# Patient Record
Sex: Female | Born: 1985 | Race: Black or African American | Hispanic: No | Marital: Single | Smoking: Never smoker
Health system: Southern US, Community
[De-identification: ages and names within clinical notes are randomized; demographics above are authoritative.]

---

## 2009-11-19 ENCOUNTER — Ambulatory Visit (HOSPITAL_COMMUNITY): Admission: RE | Admit: 2009-11-19 | Discharge: 2009-11-19 | Payer: Self-pay | Admitting: Obstetrics & Gynecology

## 2010-01-18 ENCOUNTER — Ambulatory Visit (HOSPITAL_COMMUNITY): Admission: RE | Admit: 2010-01-18 | Discharge: 2010-01-18 | Payer: Self-pay | Admitting: Family Medicine

## 2010-02-02 ENCOUNTER — Inpatient Hospital Stay (HOSPITAL_COMMUNITY): Admission: AD | Admit: 2010-02-02 | Discharge: 2010-02-04 | Payer: Self-pay | Admitting: Obstetrics & Gynecology

## 2010-02-02 ENCOUNTER — Ambulatory Visit: Payer: Self-pay | Admitting: Family Medicine

## 2010-11-06 LAB — CBC
HCT: 37.5 % (ref 36.0–46.0)
Hemoglobin: 12.3 g/dL (ref 12.0–15.0)
MCHC: 32.9 g/dL (ref 30.0–36.0)
RDW: 14.8 % (ref 11.5–15.5)
WBC: 17 10*3/uL — ABNORMAL HIGH (ref 4.0–10.5)

## 2010-11-06 LAB — GLUCOSE, CAPILLARY
Glucose-Capillary: 140 mg/dL — ABNORMAL HIGH (ref 70–99)
Glucose-Capillary: 83 mg/dL (ref 70–99)

## 2012-03-01 IMAGING — US US OB COMP +14 WK
1 series · 14 of 28 positions shown · non-contrast
Comparison: none

OBSTETRICAL ULTRASOUND:
 This ultrasound exam was performed in the [HOSPITAL] Ultrasound Department.  The OB US report was generated in the AS system, and faxed to the ordering physician.  This report is also available in [HOSPITAL]?s AccessANYware and in [REDACTED] PACS.

[Series 1: us ob comp +14 wk · 0.30mm/px · 14 of 61 slices shown]
[im 3/61]
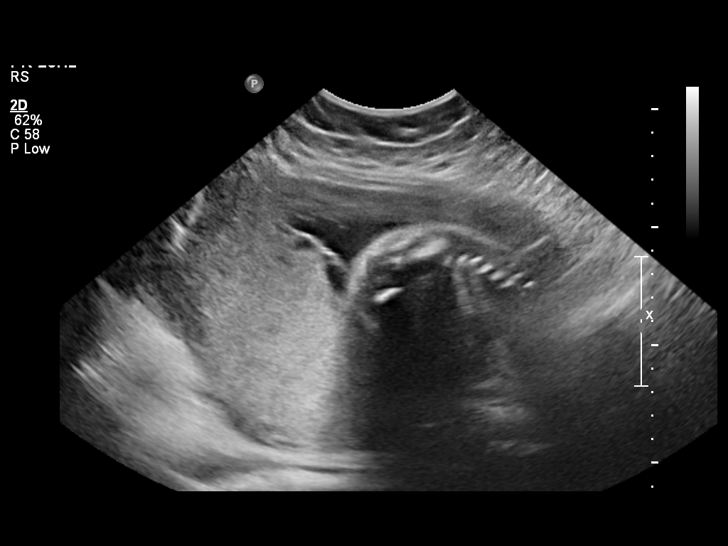
[im 7/61]
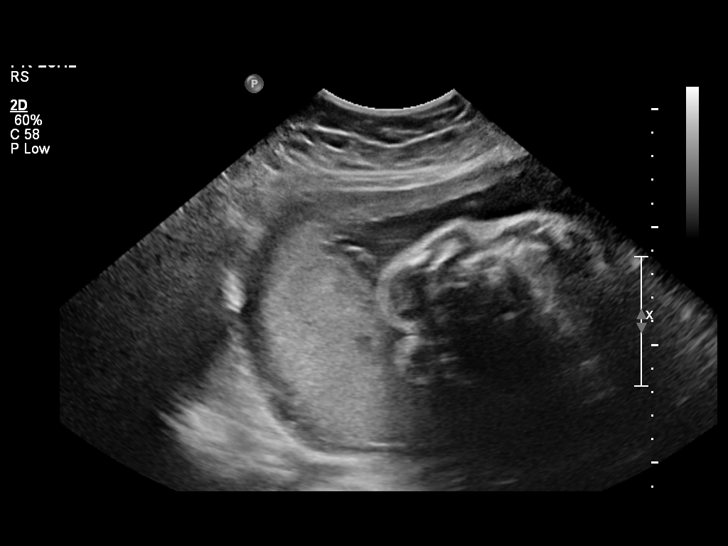
[im 12/61]
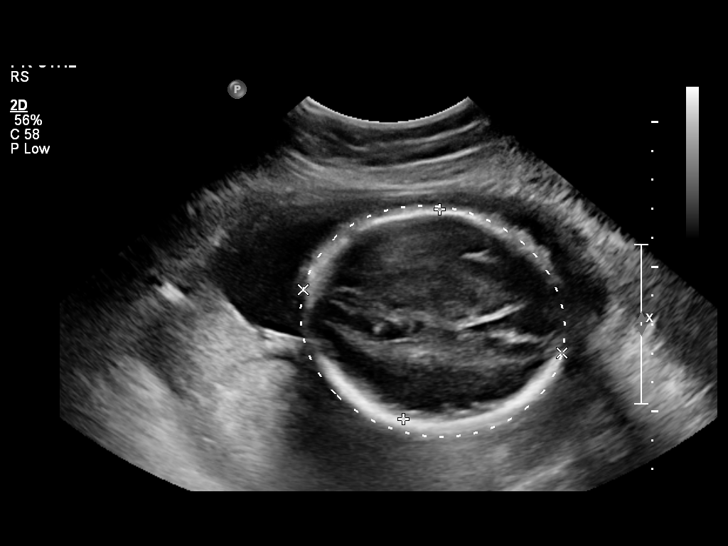
[im 16/61]
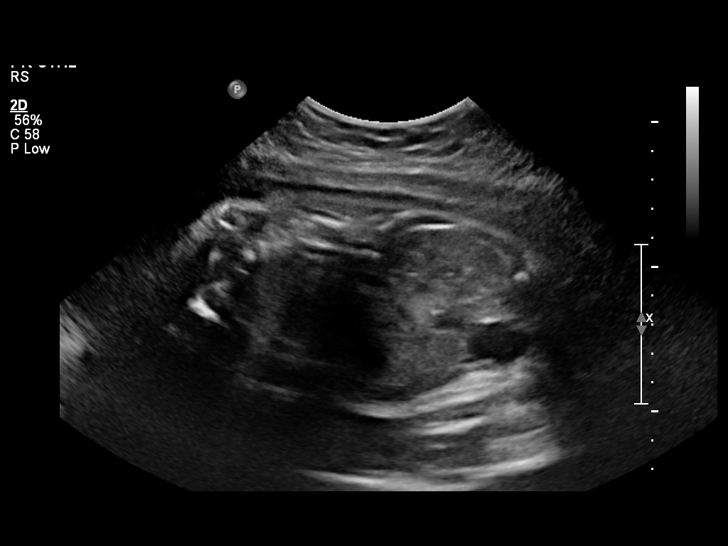
[im 21/61]
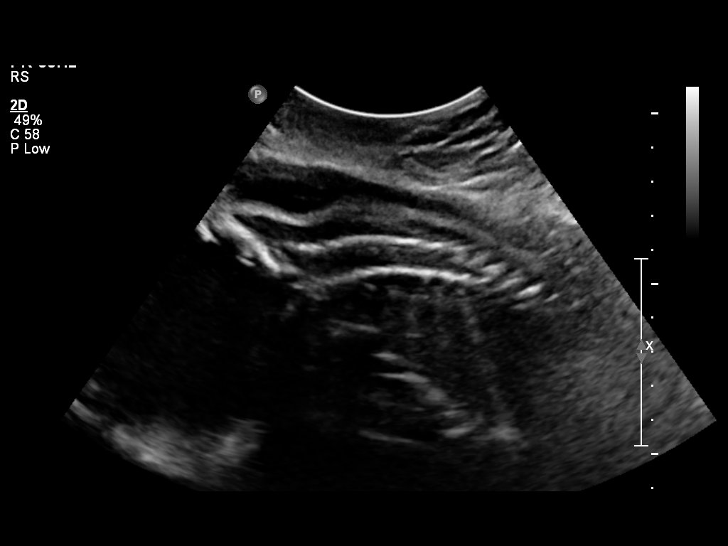
[im 25/61]
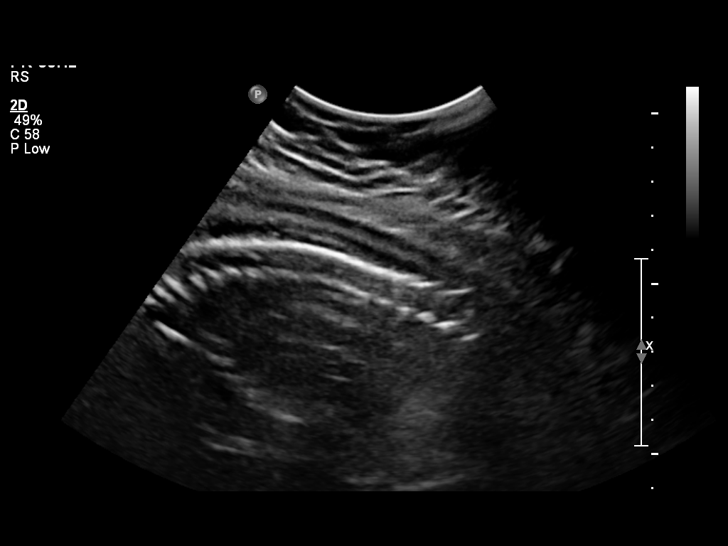
[im 29/61]
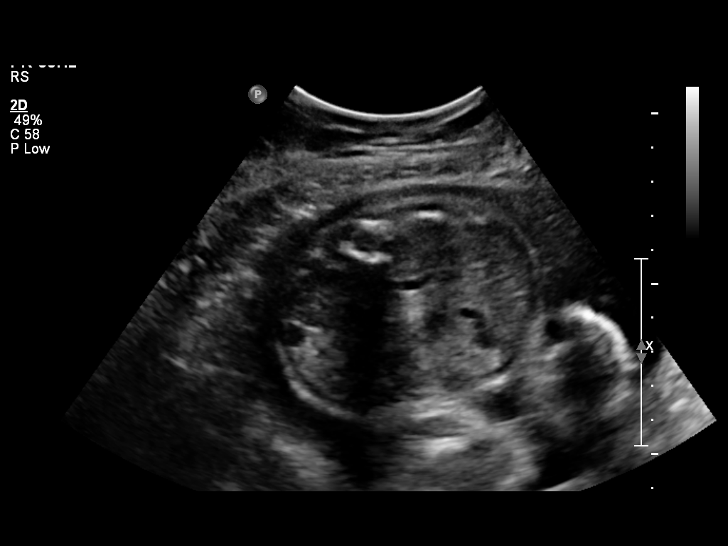
[im 34/61]
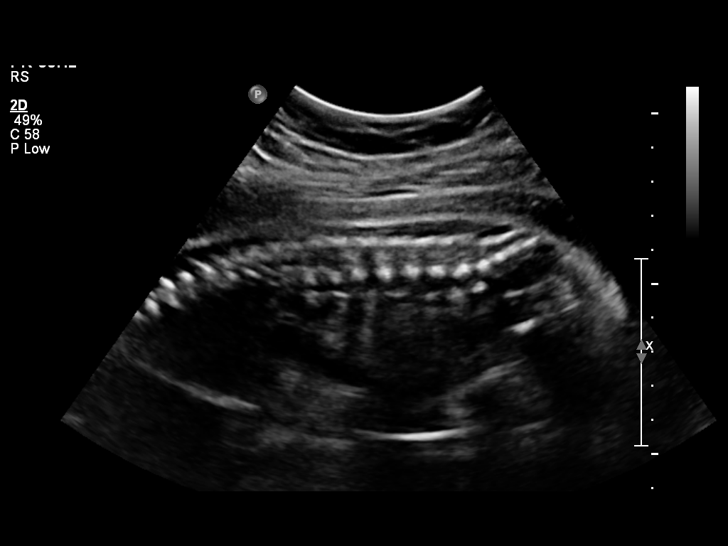
[im 38/61]
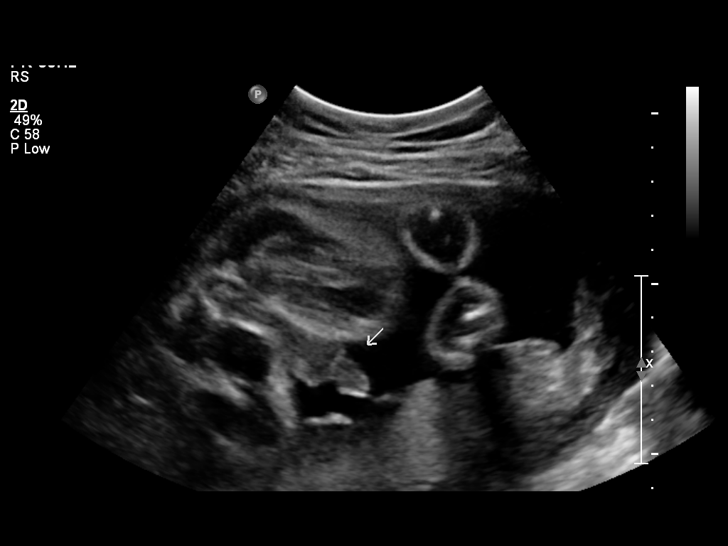
[im 43/61]
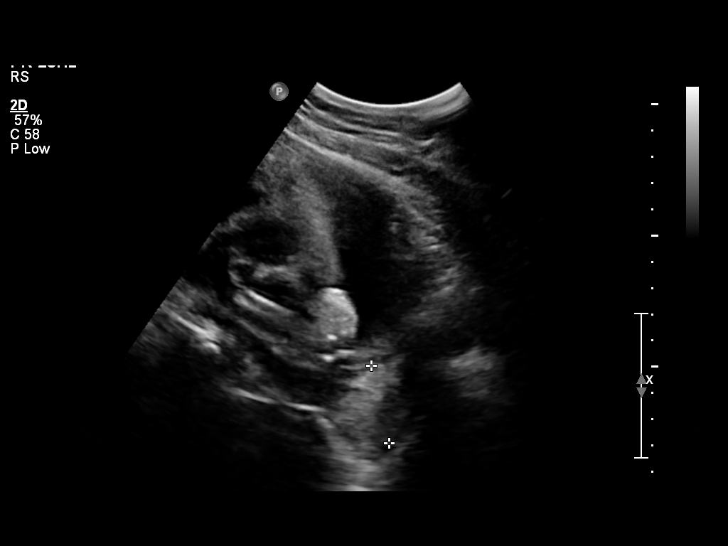
[im 47/61]
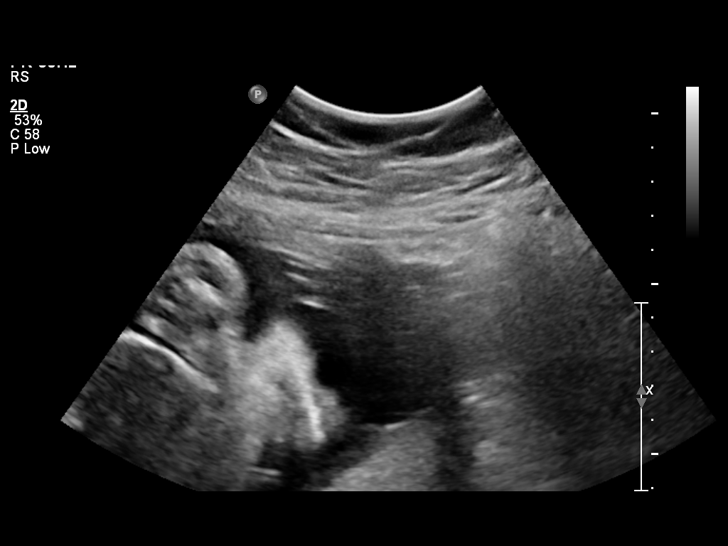
[im 52/61]
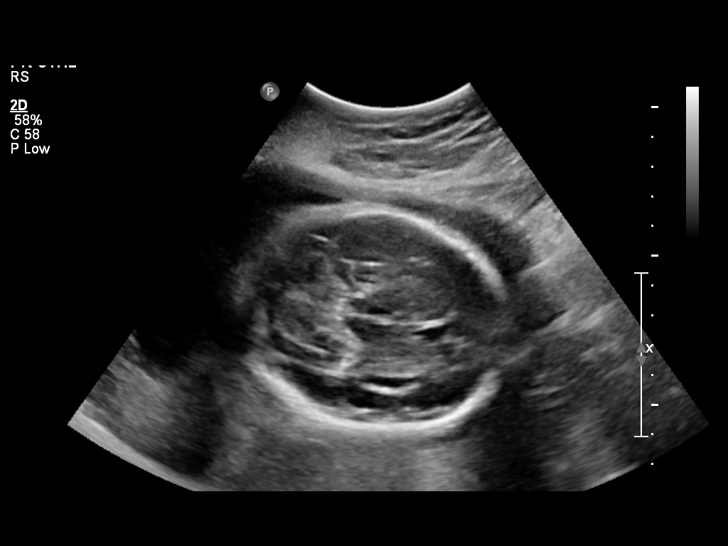
[im 56/61]
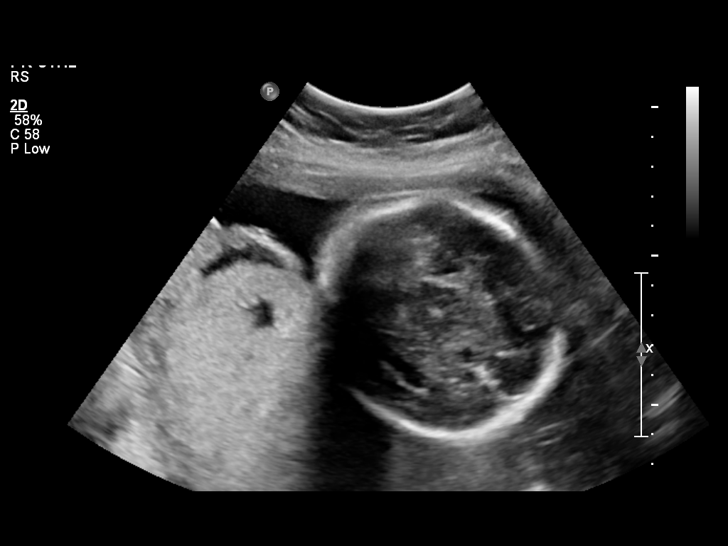
[im 61/61]
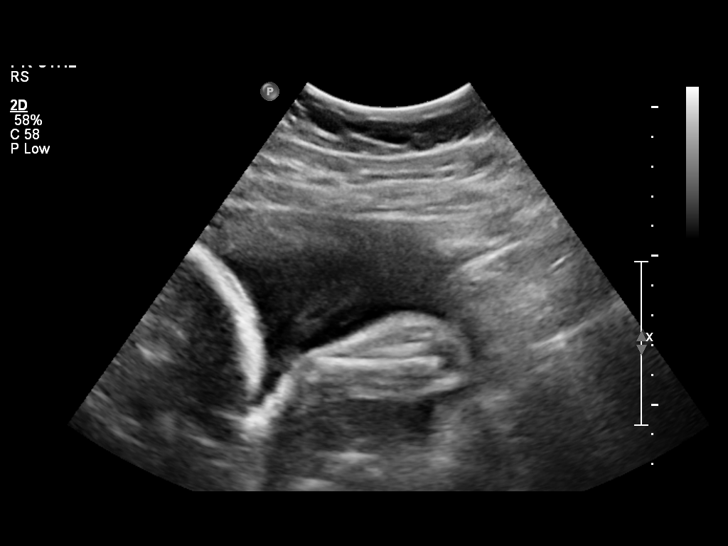

[14 of 28 positions shown; findings below may reference images not displayed]

IMPRESSION: See AS Obstetric US report.

## 2012-04-30 IMAGING — US US OB FOLLOW-UP
1 series · 14 of 28 positions shown · non-contrast
Comparison: none

OBSTETRICAL ULTRASOUND:
 This ultrasound exam was performed in the [HOSPITAL] Ultrasound Department.  The OB US report was generated in the AS system, and faxed to the ordering physician.  This report is also available in [HOSPITAL]?s [employer] and in [REDACTED] PACS.

[Series 1: us ob follow up · 0.25mm/px · 14 of 34 slices shown]
[im 2/34]
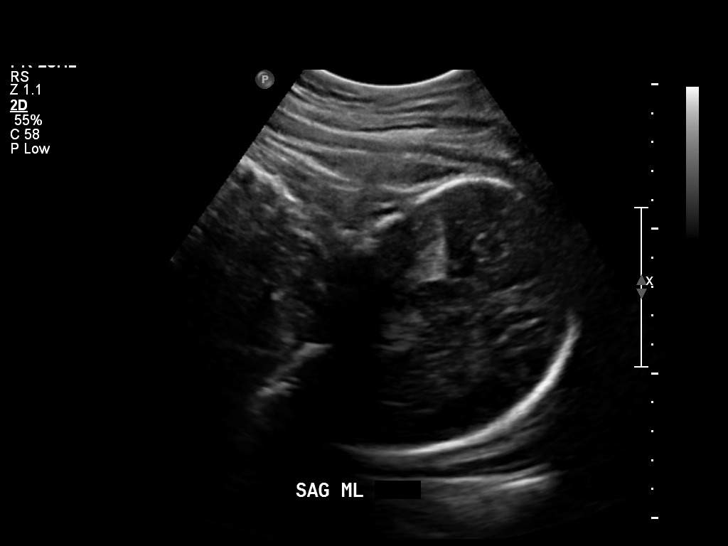
[im 4/34]
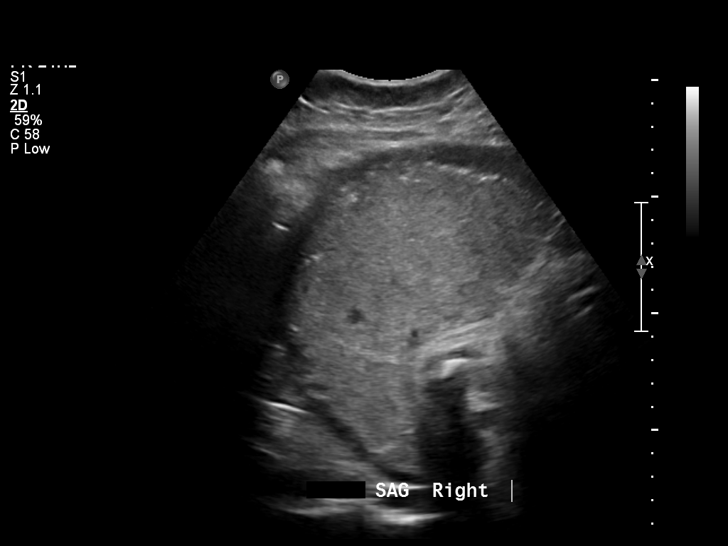
[im 7/34]
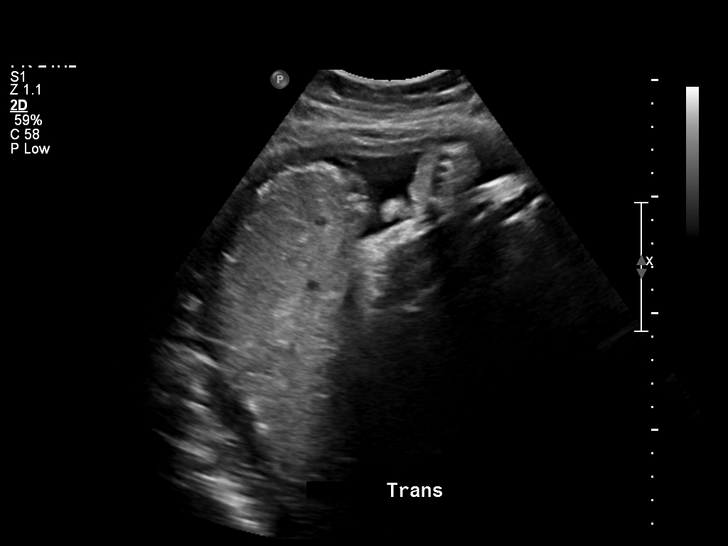
[im 9/34]
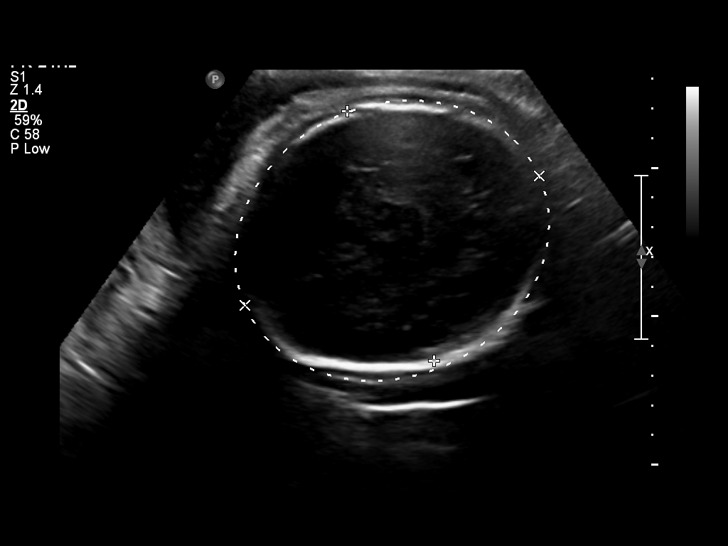
[im 12/34]
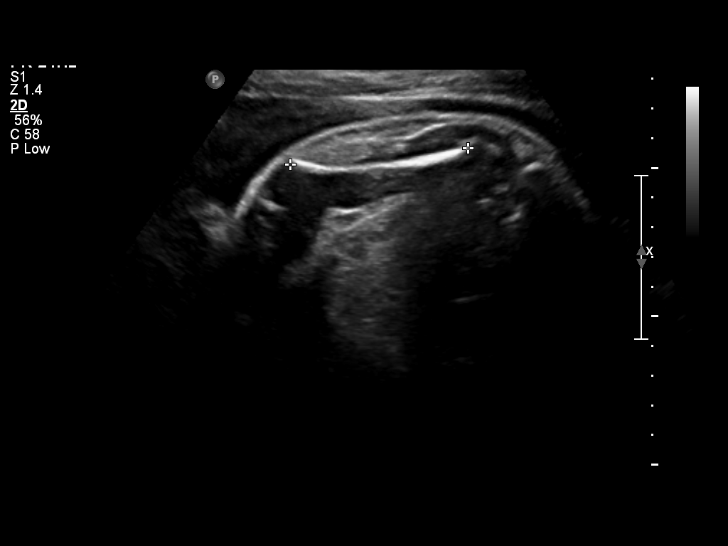
[im 14/34]
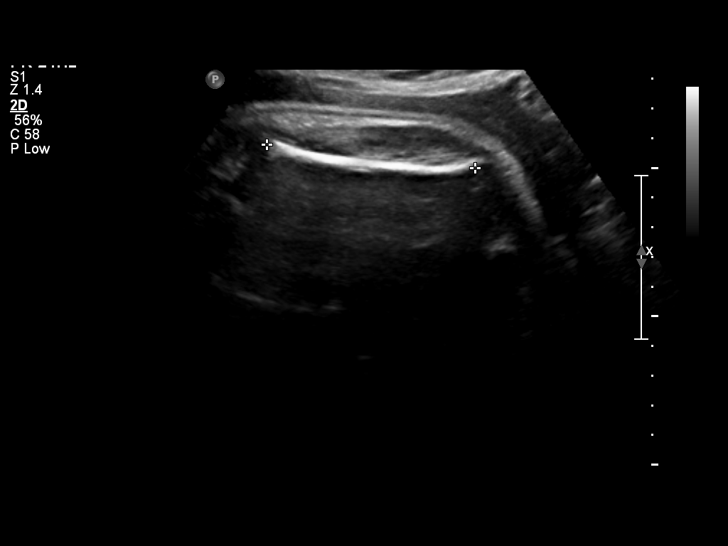
[im 16/34]
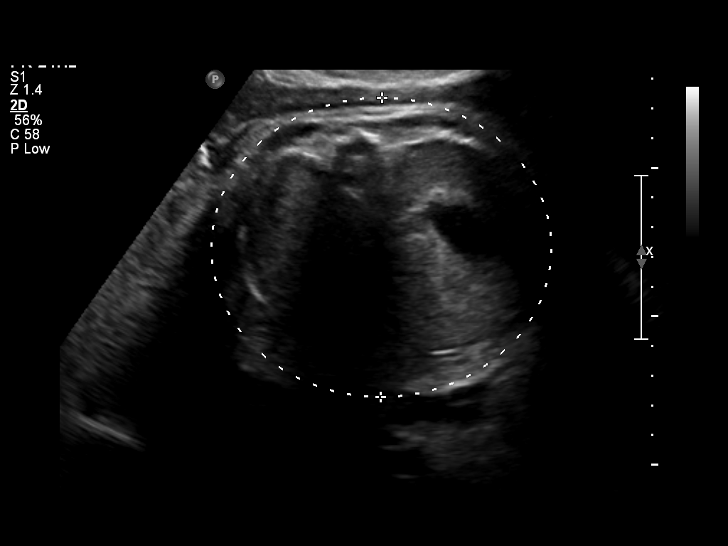
[im 19/34]
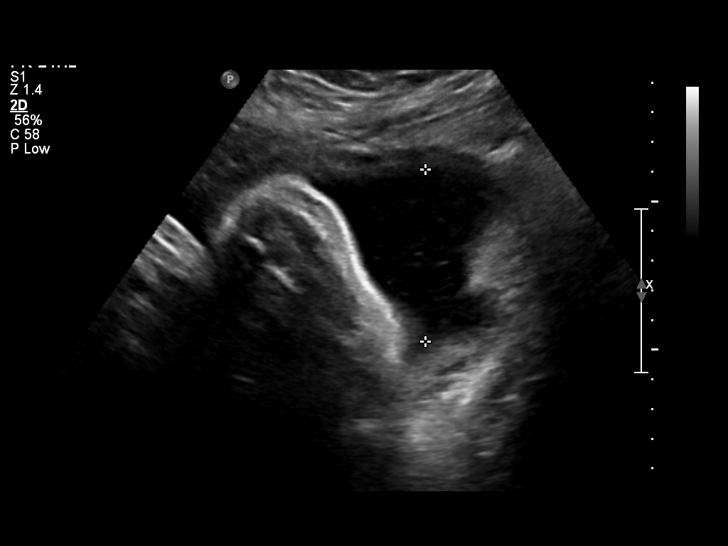
[im 21/34]
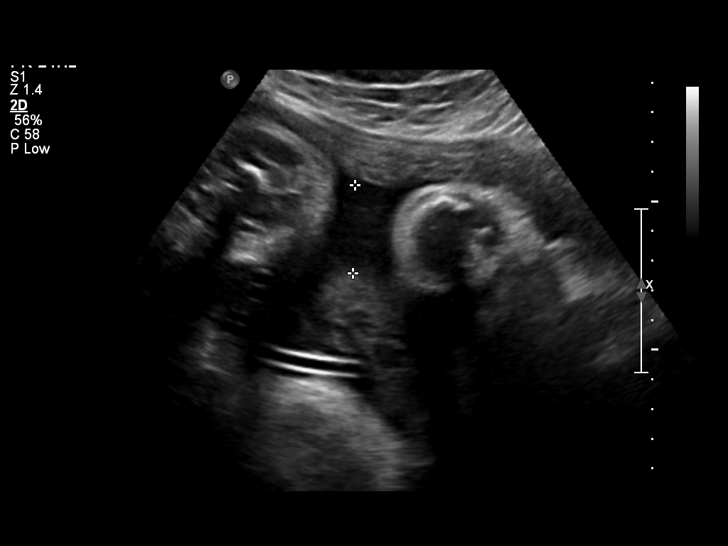
[im 24/34]
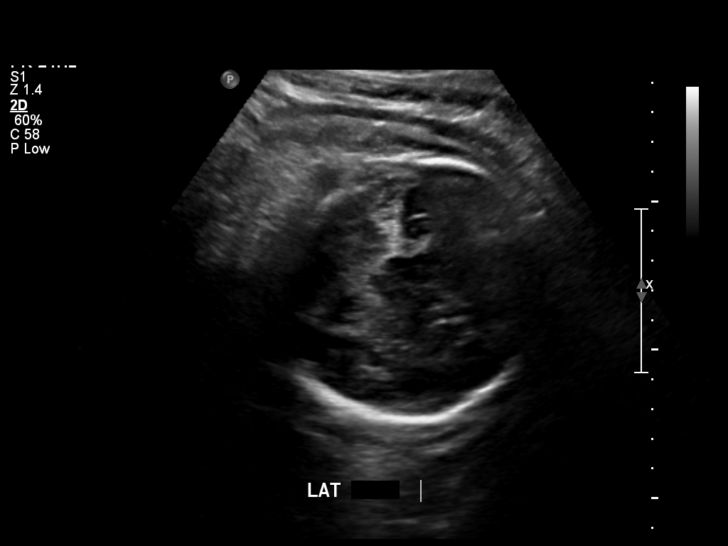
[im 26/34]
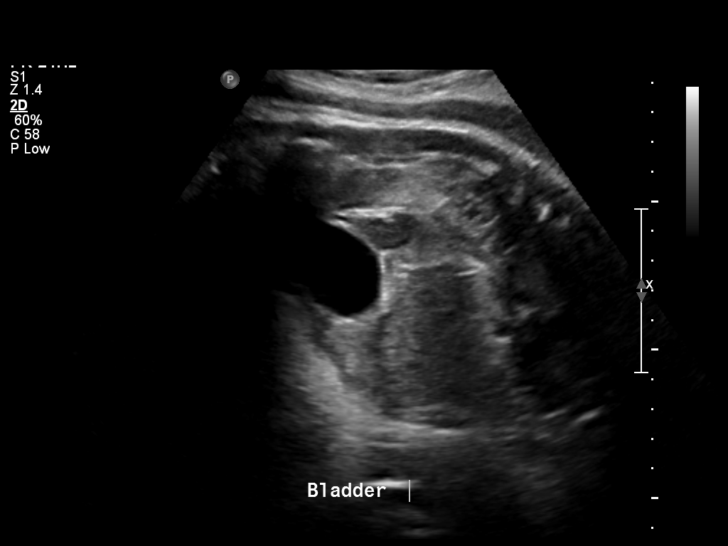
[im 29/34]
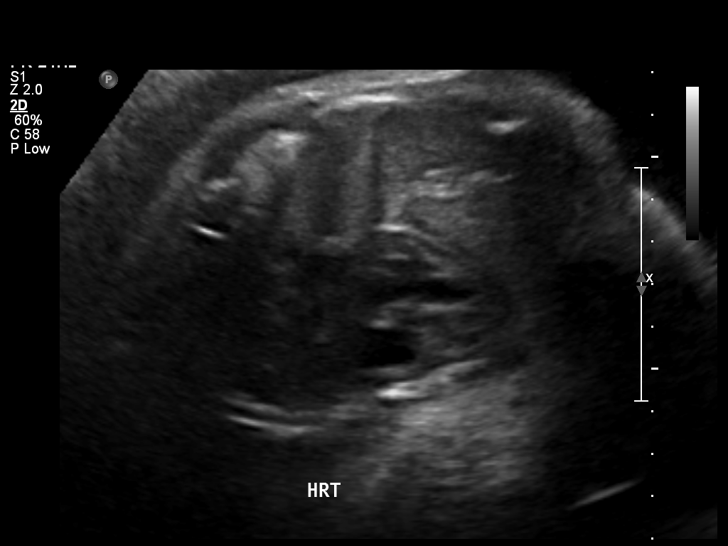
[im 31/34]
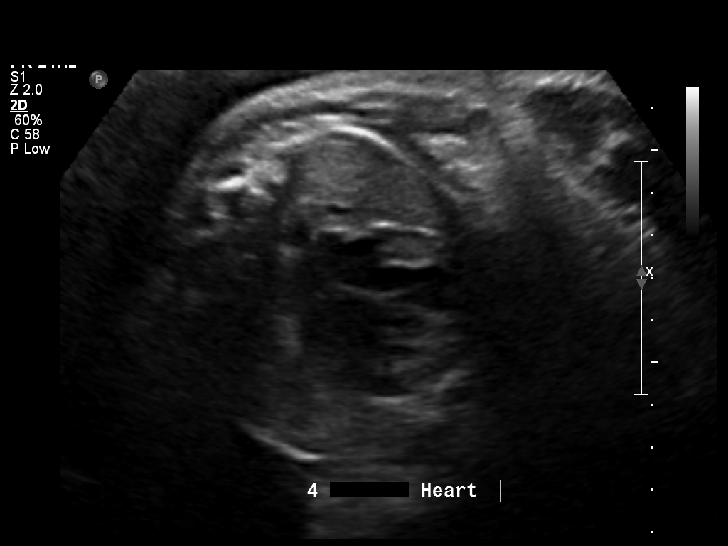
[im 34/34]
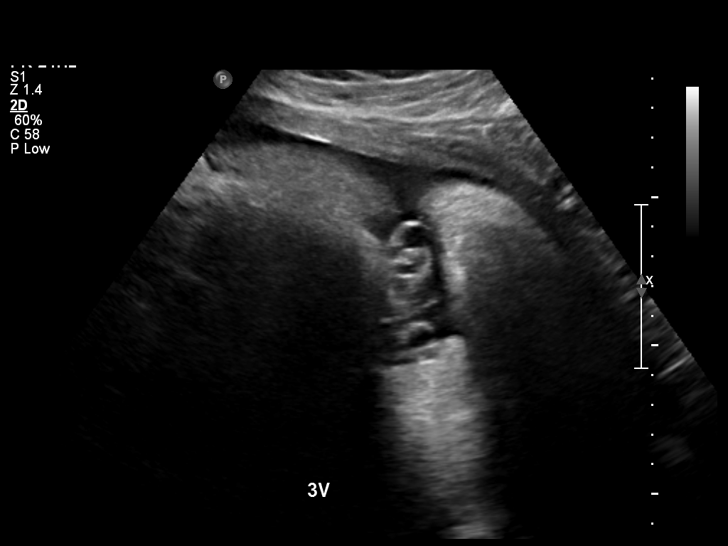

[14 of 28 positions shown; findings below may reference images not displayed]

IMPRESSION: See AS Obstetric US report.

## 2013-03-17 ENCOUNTER — Ambulatory Visit: Payer: Self-pay | Admitting: Family

## 2013-03-27 ENCOUNTER — Encounter: Payer: Self-pay | Admitting: Family

## 2013-03-27 ENCOUNTER — Ambulatory Visit (INDEPENDENT_AMBULATORY_CARE_PROVIDER_SITE_OTHER): Payer: BC Managed Care – PPO | Admitting: Family

## 2013-03-27 VITALS — BP 120/84 | HR 76 | Ht 65.0 in | Wt 179.0 lb

## 2013-03-27 DIAGNOSIS — M546 Pain in thoracic spine: Secondary | ICD-10-CM

## 2013-03-27 DIAGNOSIS — G44209 Tension-type headache, unspecified, not intractable: Secondary | ICD-10-CM

## 2013-03-27 MED ORDER — TRAMADOL HCL 50 MG PO TABS: 50.0000 mg | ORAL_TABLET | Freq: Three times a day (TID) | ORAL | Status: DC | PRN

## 2013-03-27 NOTE — Progress Notes (Signed)
  Subjective:    Patient ID: Virginia Chavez, female    DOB: [birth year], 27 y.o.   MRN: [id number]  HPI 27 year old [place] American female, new patient to the practice and then to be established. She reports had been upper back and shoulder pain that occurs during stressful times. Rates the pain a 5/10, worse with movement. She's taken Aleve in the past that has not helped. The pain has been better since she's been out of school this summer.  Patient has concerns of headaches that occur across her forehead lasting 1-2 hours typically. Usually during stressful times. Takes Aleve without much help. Has missed 2 days of work in the last    Review of Systems  Constitutional: Negative.   HENT: Negative.   Respiratory: Negative.   Cardiovascular: Negative.   Endocrine: Negative.   Genitourinary: Negative.   Neurological: Negative.   Psychiatric/Behavioral: Negative.    History reviewed. No pertinent past medical history.  History   Social History  . Marital Status: Single    Spouse Name: N/A    Number of Children: N/A  . Years of Education: N/A   Occupational History  . Not on file.   Social History Main Topics  . Smoking status: Never Smoker   . Smokeless tobacco: Not on file  . Alcohol Use: Yes     Comment: socially  . Drug Use: No  . Sexually Active: Not on file   Other Topics Concern  . Not on file   Social History Narrative  . No narrative on file    History reviewed. No pertinent past surgical history.  No family history on file.  No Known Allergies  No current outpatient prescriptions on file prior to visit.   No current facility-administered medications on file prior to visit.    BP 120/84  Pulse 76  Ht 5\' 5"  (1.651 m)  Wt 179 lb (81.194 kg)  BMI 29.79 kg/m2chart    Objective:   Physical Exam  Constitutional: She is oriented to person, place, and time. She appears well-developed and well-nourished.  HENT:  Right Ear: External ear normal.  Left  Ear: External ear normal.  Nose: Nose normal.  Mouth/Throat: Oropharynx is clear and moist.  Neck: Normal range of motion. Neck supple.  Cardiovascular: Normal rate, regular rhythm and normal heart sounds.   Pulmonary/Chest: Effort normal and breath sounds normal.  Abdominal: Soft. Bowel sounds are normal.  Musculoskeletal: Normal range of motion.  Neurological: She is alert and oriented to person, place, and time.  Skin: Skin is warm and dry.  Psychiatric: She has a normal mood and affect.          Assessment & Plan:  Assessment:   1. Muscle Tension 2. Tension headaches 3. Acute stress reaction  Plan: Tramadol as needed for headaches and pain relief. Heating pad. Consider SSRI if stress persist. Recheck as needed. Exercise daily.

## 2013-03-27 NOTE — Patient Instructions (Addendum)
Tension Headache A tension headache is a feeling of pain, pressure, or aching often felt over the front and sides of the head. The pain can be dull or can feel tight (constricting). It is the most common type of headache. Tension headaches are not normally associated with nausea or vomiting and do not get worse with physical activity. Tension headaches can last 30 minutes to several days.  CAUSES  The exact cause is not known, but it may be caused by chemicals and hormones in the brain that lead to pain. Tension headaches often begin after stress, anxiety, or depression. Other triggers may include:  Alcohol.  Caffeine (too much or withdrawal).  Respiratory infections (colds, flu, sinus infections).  Dental problems or teeth clenching.  Fatigue.  Holding your head and neck in one position too long while using a computer. SYMPTOMS   Pressure around the head.   Dull, aching head pain.   Pain felt over the front and sides of the head.   Tenderness in the muscles of the head, neck, and shoulders. DIAGNOSIS  A tension headache is often diagnosed based on:   Symptoms.   Physical examination.   A CT scan or MRI of your head. These tests may be ordered if symptoms are severe or unusual. TREATMENT  Medicines may be given to help relieve symptoms.  HOME CARE INSTRUCTIONS   Only take over-the-counter or prescription medicines for pain or discomfort as directed by your caregiver.   Lie down in a dark, quiet room when you have a headache.   Keep a journal to find out what may be triggering your headaches. For example, write down:  What you eat and drink.  How much sleep you get.  Any change to your diet or medicines.  Try massage or other relaxation techniques.   Ice packs or heat applied to the head and neck can be used. Use these 3 to 4 times per day for 15 to 20 minutes each time, or as needed.   Limit stress.   Sit up straight, and do not tense your muscles.    Quit smoking if you smoke.  Limit alcohol use.  Decrease the amount of caffeine you drink, or stop drinking caffeine.  Eat and exercise regularly.  Get 7 to 9 hours of sleep, or as recommended by your caregiver.  Avoid excessive use of pain medicine as recurrent headaches can occur.  SEEK MEDICAL CARE IF:   You have problems with the medicines you were prescribed.  Your medicines do not work.  You have a change from the usual headache.  You have nausea or vomiting. SEEK IMMEDIATE MEDICAL CARE IF:   Your headache becomes severe.  You have a fever.  You have a stiff neck.  You have loss of vision.  You have muscular weakness or loss of muscle control.  You lose your balance or have trouble walking.  You feel faint or pass out.  You have severe symptoms that are different from your first symptoms. MAKE SURE YOU:   Understand these instructions.  Will watch your condition.  Will get help right away if you are not doing well or get worse. Document Released: 08/07/2005 Document Revised: 10/30/2011 Document Reviewed: 07/28/2011 [facility] Patient Information 2014 [place], [place].    Stress Stress-related medical problems are becoming increasingly common. The body has a built-in physical response to stressful situations. Faced with pressure, challenge or danger, we need to react quickly. Our bodies release hormones such as cortisol and adrenaline to  help do this. These hormones are part of the "fight or flight" response and affect the metabolic rate, heart rate and blood pressure, resulting in a heightened, stressed state that prepares the body for optimum performance in dealing with a stressful situation. It is likely that early man required these mechanisms to stay alive, but usually modern stresses do not call for this, and the same hormones released in today's world can damage health and reduce coping ability. CAUSES  Pressure to perform at work, at school or  in sports.  Threats of physical violence.  Money worries.  Arguments.  Family conflicts.  Divorce or separation from significant other.  Bereavement.  New job or unemployment.  Changes in location.  Alcohol or drug abuse. SOMETIMES, THERE IS NO PARTICULAR REASON FOR DEVELOPING STRESS. Almost all people are at risk of being stressed at some time in their lives. It is important to know that some stress is temporary and some is long term.  Temporary stress will go away when a situation is resolved. Most people can cope with short periods of stress, and it can often be relieved by relaxing, taking a walk, chatting through issues with friends, or having a good night's sleep.  Chronic (long-term, continuous) stress is much harder to deal with. It can be psychologically and emotionally damaging. It can be harmful both for an individual and for friends and family. SYMPTOMS Everyone reacts to stress differently. There are some common effects that help [place] recognize it. In times of extreme stress, people may:  Shake uncontrollably.  Breathe faster and deeper than normal (hyperventilate).  Vomit.  For people with asthma, stress can trigger an attack.  For some people, stress may trigger migraine headaches, ulcers, and body pain. PHYSICAL EFFECTS OF STRESS MAY INCLUDE:  Loss of energy.  Skin problems.  Aches and pains resulting from tense muscles, including neck ache, backache and tension headaches.  Increased pain from arthritis and other conditions.  Irregular heart beat (palpitations).  Periods of irritability or anger.  Apathy or depression.  Anxiety (feeling uptight or worrying).  Unusual behavior.  Loss of appetite.  Comfort eating.  Lack of concentration.  Loss of, or decreased, sex-drive.  Increased smoking, drinking, or recreational drug use.  For women, missed periods.  Ulcers, joint pain, and muscle pain. Post-traumatic stress is the stress caused  by any serious accident, strong emotional damage, or extremely difficult or violent experience such as rape or war. Post-traumatic stress victims can experience mixtures of emotions such as fear, shame, depression, guilt or anger. It may include recurrent memories or images that may be haunting. These feelings can last for weeks, months or even years after the traumatic event that triggered them. Specialized treatment, possibly with medicines and psychological therapies, is available. If stress is causing physical symptoms, severe distress or making it difficult for you to function as normal, it is worth seeing your caregiver. It is important to remember that although stress is a usual part of life, extreme or prolonged stress can lead to other illnesses that will need treatment. It is better to visit a doctor sooner rather than later. Stress has been linked to the development of high blood pressure and heart disease, as well as insomnia and depression. There is no diagnostic test for stress since everyone reacts to it differently. But a caregiver will be able to spot the physical symptoms, such as:  Headaches.  Shingles.  Ulcers. Emotional distress such as intense worry, low mood or irritability should be  detected when the doctor asks pertinent questions to identify any underlying problems that might be the cause. In case there are physical reasons for the symptoms, the doctor may also want to do some tests to exclude certain conditions. If you feel that you are suffering from stress, try to identify the aspects of your life that are causing it. Sometimes you may not be able to change or avoid them, but even a small change can have a positive ripple effect. A simple lifestyle change can make all the difference. STRATEGIES THAT CAN HELP DEAL WITH STRESS:  Delegating or sharing responsibilities.  Avoiding confrontations.  Learning to be more assertive.  Regular exercise.  Avoid using alcohol or  street drugs to cope.  Eating a healthy, balanced diet, rich in fruit and vegetables and proteins.  Finding humor or absurdity in stressful situations.  Never taking on more than you know you can handle comfortably.  Organizing your time better to get as much done as possible.  Talking to friends or family and sharing your thoughts and fears.  Listening to music or relaxation tapes.  Tensing and then relaxing your muscles, starting at the toes and working up to the head and neck. If you think that you would benefit from help, either in identifying the things that are causing your stress or in learning techniques to help you relax, see a caregiver who is capable of helping you with this. Rather than relying on medications, it is usually better to try and identify the things in your life that are causing stress and try to deal with them. There are many techniques of managing stress including counseling, psychotherapy, aromatherapy, yoga, and exercise. Your caregiver can help you determine what is best for you. Document Released: 10/28/2002 Document Revised: 10/30/2011 Document Reviewed: 09/24/2007 [facility] Patient Information 2014 [place], [place].

## 2013-05-05 DIAGNOSIS — Z0279 Encounter for issue of other medical certificate: Secondary | ICD-10-CM

## 2013-05-07 ENCOUNTER — Encounter: Payer: Self-pay | Admitting: Family

## 2013-05-28 ENCOUNTER — Telehealth: Payer: Self-pay | Admitting: Family

## 2013-05-28 NOTE — Telephone Encounter (Signed)
Patient Information:  Caller Name: Tarnesha  Phone: [phone]  Patient: Virginia Chavez, Virginia Chavez  Gender: Female  DOB: May 20, 1986  Age: 27 Years  PCP: Adline Mango [facility])  Pregnant: No  Office Follow Up:  Does the office need to follow up with this patient?: No  Instructions For The Office: N/A  RN Note:  Headache "like a headband" rated 4/10 and disrupts sleep. Upper back/shoulder discomfort and neck feels tight.  Asking if headache could be related to stress/anxiety. Requested appointment for 05/29/13.    Symptoms  Reason For Call & Symptoms: Tension headaches and unable to sleep. Tramadol is minimally helpful.  Reviewed Health History In EMR: Yes  Reviewed Medications In EMR: Yes  Reviewed Allergies In EMR: Yes  Reviewed Surgeries / Procedures: Yes  Date of Onset of Symptoms: 05/26/2013  Treatments Tried: Tramadol  Treatments Tried Worked: No OB / GYN:  LMP: 05/25/2013  Guideline(s) Used:  Headache  Disposition Per Guideline:   See Today in Office  Reason For Disposition Reached:   Patient wants to be seen  Advice Given:  Reassurance - Muscle Tension Headache:  You have told me that this headache is similar to your previously diagnosed muscle tension headaches.  The majority of headaches are caused by muscle tension.  The discomfort is usually diffuse and may be described as a "tight band" around the head. It may radiate down into the neck and shoulders. The discomfort can be aggravated by emotional stress.  This sounds like a painful headache that you are having, but there are pain medications you can take and other instructions I can give you to reduce the pain.  Pain Medicines:  For pain relief, you can take either acetaminophen, ibuprofen, or naproxen.  Ibuprofen (e.g., Motrin, Advil):  Take 400 mg (two 200 mg pills) by mouth every 6 hours.  The most you should take each day is 1,200 mg (six 200 mg pills), unless your doctor has told you to take more.  Rest:   Lie down in a dark, quiet place and try to relax. Close your eyes and imagine your entire body relaxing.  Apply Cold to the Area:   Apply a cold wet washcloth or cold pack to the forehead for 20 minutes.  Stretching:   Stretch and massage any tight neck muscles.  Call Back If:  Headache lasts longer than 24 hours  You become worse.  Patient Will Follow Care Advice:  YES  Appointment Scheduled:  05/29/2013 09:30:00 Appointment Scheduled Provider:  Adline Mango [facility])

## 2013-05-29 ENCOUNTER — Encounter: Payer: Self-pay | Admitting: Family

## 2013-05-29 ENCOUNTER — Ambulatory Visit (INDEPENDENT_AMBULATORY_CARE_PROVIDER_SITE_OTHER): Payer: BC Managed Care – PPO | Admitting: Family

## 2013-05-29 VITALS — BP 100/60 | HR 74 | Wt 188.0 lb

## 2013-05-29 DIAGNOSIS — F410 Panic disorder [episodic paroxysmal anxiety] without agoraphobia: Secondary | ICD-10-CM

## 2013-05-29 DIAGNOSIS — R51 Headache: Secondary | ICD-10-CM

## 2013-05-29 MED ORDER — BUPROPION HCL ER (XL) 150 MG PO TB24: 150.0000 mg | ORAL_TABLET | Freq: Every day | ORAL | Status: DC

## 2013-05-29 MED ORDER — KETOROLAC TROMETHAMINE 60 MG/2ML IM SOLN
60.0000 mg | Freq: Once | INTRAMUSCULAR | Status: AC
  Administered 2013-05-29: 60 mg via INTRAMUSCULAR

## 2013-05-29 MED ORDER — IBUPROFEN 800 MG PO TABS: 800.0000 mg | ORAL_TABLET | Freq: Three times a day (TID) | ORAL | Status: DC | PRN

## 2013-05-29 NOTE — Telephone Encounter (Signed)
Noted  

## 2013-05-29 NOTE — Patient Instructions (Addendum)
Tension Headache A tension headache is a feeling of pain, pressure, or aching often felt over the front and sides of the head. The pain can be dull or can feel tight (constricting). It is the most common type of headache. Tension headaches are not normally associated with nausea or vomiting and do not get worse with physical activity. Tension headaches can last 30 minutes to several days.  CAUSES  The exact cause is not known, but it may be caused by chemicals and hormones in the brain that lead to pain. Tension headaches often begin after stress, anxiety, or depression. Other triggers may include:  Alcohol.  Caffeine (too much or withdrawal).  Respiratory infections (colds, flu, sinus infections).  Dental problems or teeth clenching.  Fatigue.  Holding your head and neck in one position too long while using a computer. SYMPTOMS   Pressure around the head.   Dull, aching head pain.   Pain felt over the front and sides of the head.   Tenderness in the muscles of the head, neck, and shoulders. DIAGNOSIS  A tension headache is often diagnosed based on:   Symptoms.   Physical examination.   A CT scan or MRI of your head. These tests may be ordered if symptoms are severe or unusual. TREATMENT  Medicines may be given to help relieve symptoms.  HOME CARE INSTRUCTIONS   Only take over-the-counter or prescription medicines for pain or discomfort as directed by your caregiver.   Lie down in a dark, quiet room when you have a headache.   Keep a journal to find out what may be triggering your headaches. For example, write down:  What you eat and drink.  How much sleep you get.  Any change to your diet or medicines.  Try massage or other relaxation techniques.   Ice packs or heat applied to the head and neck can be used. Use these 3 to 4 times per day for 15 to 20 minutes each time, or as needed.   Limit stress.   Sit up straight, and do not tense your muscles.    Quit smoking if you smoke.  Limit alcohol use.  Decrease the amount of caffeine you drink, or stop drinking caffeine.  Eat and exercise regularly.  Get 7 to 9 hours of sleep, or as recommended by your caregiver.  Avoid excessive use of pain medicine as recurrent headaches can occur.  SEEK MEDICAL CARE IF:   You have problems with the medicines you were prescribed.  Your medicines do not work.  You have a change from the usual headache.  You have nausea or vomiting. SEEK IMMEDIATE MEDICAL CARE IF:   Your headache becomes severe.  You have a fever.  You have a stiff neck.  You have loss of vision.  You have muscular weakness or loss of muscle control.  You lose your balance or have trouble walking.  You feel faint or pass out.  You have severe symptoms that are different from your first symptoms. MAKE SURE YOU:   Understand these instructions.  Will watch your condition.  Will get help right away if you are not doing well or get worse. Document Released: 08/07/2005 Document Revised: 10/30/2011 Document Reviewed: 07/28/2011 [facility] Patient Information 2014 [place], [place].   Anxiety and Panic Attacks Your caregiver has informed you that you are having an anxiety or panic attack. There may be many forms of this. Most of the time these attacks come suddenly and without warning. They come at any  time of day, including periods of sleep, and at any time of life. They may be strong and unexplained. Although panic attacks are very scary, they are physically harmless. Sometimes the cause of your anxiety is not known. Anxiety is a protective mechanism of the body in its fight or flight mechanism. Most of these perceived danger situations are actually nonphysical situations (such as anxiety over losing a job). CAUSES  The causes of an anxiety or panic attack are many. Panic attacks may occur in otherwise healthy people given a certain set of circumstances. There may be a  genetic cause for panic attacks. Some medications may also have anxiety as a side effect. SYMPTOMS  Some of the most common feelings are:  Intense terror.  Dizziness, feeling faint.  Hot and cold flashes.  Fear of going crazy.  Feelings that nothing is real.  Sweating.  Shaking.  Chest pain or a fast heartbeat (palpitations).  Smothering, choking sensations.  Feelings of impending doom and that death is near.  Tingling of extremities, this may be from over-breathing.  Altered reality (derealization).  Being detached from yourself (depersonalization). Several symptoms can be present to make up anxiety or panic attacks. DIAGNOSIS  The evaluation by your caregiver will depend on the type of symptoms you are experiencing. The diagnosis of anxiety or panic attack is made when no physical illness can be determined to be a cause of the symptoms. TREATMENT  Treatment to prevent anxiety and panic attacks may include:  Avoidance of circumstances that cause anxiety.  Reassurance and relaxation.  Regular exercise.  Relaxation therapies, such as yoga.  Psychotherapy with a psychiatrist or therapist.  Avoidance of caffeine, alcohol and illegal drugs.  Prescribed medication. SEEK IMMEDIATE MEDICAL CARE IF:   You experience panic attack symptoms that are different than your usual symptoms.  You have any worsening or concerning symptoms. Document Released: 08/07/2005 Document Revised: 10/30/2011 Document Reviewed: 12/09/2009 [facility] Patient Information 2014 [place], [place].

## 2013-05-29 NOTE — Progress Notes (Signed)
  Subjective:    Patient ID: Virginia Chavez, female    DOB: Jan 11, 1986, 27 y.o.   MRN: [id number]  HPI 27 year old [place] American female, nonsmoker in today with concerns of panic attacks and anxiety. Patient reports that approximately twice a week she experiences a feeling of feeling overwhelmed, sweating and chest palpitations that are typically surrounding an event that caused anxiety. She's able to calm down after several minutes of deep breathing exercises. She's never had anxiety issues in the past.  Continues to have tension headaches particularly to the posterior aspect of her hand extended into her shoulders bilaterally. She's been taking tramadol and it causes drowsiness. Has taken 800 mg of ibuprofen that has worked effectively she actually much better.   Review of Systems  Constitutional: Negative.   Respiratory: Negative.   Cardiovascular: Negative.   Genitourinary: Negative.   Musculoskeletal: Negative.   Skin: Negative.   Allergic/Immunologic: Negative.   Neurological: Positive for headaches. Negative for dizziness and light-headedness.  Hematological: Negative.   Psychiatric/Behavioral: Negative for sleep disturbance. The patient is nervous/anxious.    History reviewed. No pertinent past medical history.  History   Social History  . Marital Status: Single    Spouse Name: N/A    Number of Children: N/A  . Years of Education: N/A   Occupational History  . Not on file.   Social History Main Topics  . Smoking status: Never Smoker   . Smokeless tobacco: Not on file  . Alcohol Use: Yes     Comment: socially  . Drug Use: No  . Sexual Activity: Not on file   Other Topics Concern  . Not on file   Social History Narrative  . No narrative on file    History reviewed. No pertinent past surgical history.  No family history on file.  No Known Allergies  No current outpatient prescriptions on file prior to visit.   No current facility-administered  medications on file prior to visit.    BP 100/60  Pulse 74  Wt 188 lb (85.276 kg)  BMI 31.28 kg/m2chart    Objective:   Physical Exam  Constitutional: She is oriented to person, place, and time. She appears well-developed and well-nourished.  HENT:  Head: Normocephalic.  Right Ear: External ear normal.  Left Ear: External ear normal.  Mouth/Throat: Oropharynx is clear and moist.  Eyes: Conjunctivae are normal. Pupils are equal, round, and reactive to light.  Neck: Normal range of motion. Neck supple. No thyromegaly present.  Cardiovascular: Normal rate, regular rhythm and normal heart sounds.   Pulmonary/Chest: Effort normal and breath sounds normal.  Musculoskeletal: Normal range of motion.  Neurological: She is alert and oriented to person, place, and time.  Skin: Skin is warm.  Psychiatric: She has a normal mood and affect.     Toradol 60mg  IM x 1.      Assessment & Plan:  Assessment: 1. Anxiety 2. Panic attacks 3. Tension headaches  Plan: Discontinue tramadol. Start ibuprofen 800 mg 3 times a day as needed for headache with food. Wellbutrin XL and 50 mg once daily to help with anxiety. Patient called the office with any questions or concerns. Recheck as scheduled, and as needed.

## 2013-05-30 ENCOUNTER — Telehealth: Payer: Self-pay | Admitting: Family

## 2013-05-30 NOTE — Telephone Encounter (Signed)
Patient Information:  Caller Name: Virginia Chavez  Phone: [phone]  Patient: Virginia Chavez, Virginia Chavez  Gender: Female  DOB: 1986/02/04  Age: 27 Years  PCP: Adline Mango [facility])  Pregnant: No  Office Follow Up:  Does the office need to follow up with this patient?: No  Instructions For The Office: N/A  RN Note:  Possible reaction to the Adhesive tape. Home care advice and call back parameters reviewed with patient. Advised to mointor closely and call back for questions, changes or concerns.  Symptoms  Reason For Call & Symptoms: Patient was in the office yesterday 05/29/13 and given a shot for tension headaches.   She received the injection of Toradol  in her Left buttock. She left the bandaid on for 12 hours before removing.  When removing the bandaid it was red at adhesive area.  Today, 05/30/13.  She states the area is sore but she noticed a small amount of redness and bumps at the adhesive area with swelling.  No other area of bumps or redness, slight itching.  Injection site normal.  Reviewed Health History In EMR: Yes  Reviewed Medications In EMR: Yes  Reviewed Allergies In EMR: Yes  Reviewed Surgeries / Procedures: Yes  Date of Onset of Symptoms: 05/29/2013 OB / GYN:  LMP: 05/25/2013  Guideline(s) Used:  Rash or Redness - Localized  Disposition Per Guideline:   Home Care  Reason For Disposition Reached:   Mild localized rash  Advice Given:  Avoid Soap:  Wash the area once thoroughly with soap to remove any remaining irritants. Thereafter avoid soaps to this area. Cleanse the area when needed with warm water.  Apply Cold to the Area:  Apply or soak in cold water for 20 minutes every 3 to 4 hours to reduce itching or pain.  Hydrocortisone Cream for Itching:   Keep the cream in the refrigerator (Reason: it feels better if applied cold).  Available over-the-counter in [place] as 0.5% and 1% cream.  Avoid Scratching:  Try not to scratch. Cut your fingernails  short.  Call Back If:  Rash spreads or becomes worse  Rash lasts longer than 1 week  You become worse.  Patient Will Follow Care Advice:  YES

## 2013-06-23 ENCOUNTER — Ambulatory Visit (INDEPENDENT_AMBULATORY_CARE_PROVIDER_SITE_OTHER): Payer: BC Managed Care – PPO | Admitting: Family

## 2013-06-23 ENCOUNTER — Encounter: Payer: Self-pay | Admitting: Family

## 2013-06-23 ENCOUNTER — Ambulatory Visit: Payer: BC Managed Care – PPO | Admitting: Family

## 2013-06-23 DIAGNOSIS — G44209 Tension-type headache, unspecified, not intractable: Secondary | ICD-10-CM

## 2013-06-23 DIAGNOSIS — R635 Abnormal weight gain: Secondary | ICD-10-CM

## 2013-06-23 DIAGNOSIS — F411 Generalized anxiety disorder: Secondary | ICD-10-CM

## 2013-06-23 NOTE — Patient Instructions (Signed)

## 2013-06-23 NOTE — Progress Notes (Signed)
  Subjective:    Patient ID: Virginia Chavez, female    DOB: 1985/09/16, 27 y.o.   MRN: [id number]  HPI  27 year old [place] American female, nonsmoker is in today for recheck of anxiety, panic attacks and headaches. She's currently taking Wellbutrin 150 mg once daily. She reports that this is unremarkably well. She's noticed a decrease in her headaches as well as her stress and anxiety level. Has not had any panic attacks. She denies any feelings of helplessness, hopelessness, thoughts of death or dying. Patient has gained weight since her last office visit but is eating late and not exercising.   Review of Systems  Constitutional: Negative.   Respiratory: Negative.   Cardiovascular: Negative.   Gastrointestinal: Negative.   Genitourinary: Negative.   Musculoskeletal: Negative.   Neurological: Negative.   Psychiatric/Behavioral: Negative.    No past medical history on file.  History   Social History  . Marital Status: Single    Spouse Name: N/A    Number of Children: N/A  . Years of Education: N/A   Occupational History  . Not on file.   Social History Main Topics  . Smoking status: Never Smoker   . Smokeless tobacco: Not on file  . Alcohol Use: Yes     Comment: socially  . Drug Use: No  . Sexual Activity: Not on file   Other Topics Concern  . Not on file   Social History Narrative  . No narrative on file    No past surgical history on file.  No family history on file.  No Known Allergies  Current Outpatient Prescriptions on File Prior to Visit  Medication Sig Dispense Refill  . buPROPion (WELLBUTRIN XL) 150 MG 24 hr tablet Take 1 tablet (150 mg total) by mouth daily.  30 tablet  3  . ibuprofen (ADVIL,MOTRIN) 800 MG tablet Take 1 tablet (800 mg total) by mouth every 8 (eight) hours as needed for pain.  60 tablet  1   No current facility-administered medications on file prior to visit.    There were no vitals taken for this visit.chart    Objective:   Physical Exam  Constitutional: She is oriented to person, place, and time. She appears well-developed and well-nourished.  Neck: Normal range of motion. Neck supple.  Cardiovascular: Normal rate, regular rhythm and normal heart sounds.   Pulmonary/Chest: Effort normal and breath sounds normal.  Musculoskeletal: Normal range of motion.  Neurological: She is alert and oriented to person, place, and time. She has normal reflexes. No cranial nerve deficit. Coordination normal.  Skin: Skin is warm and dry.  Psychiatric: She has a normal mood and affect.          Assessment & Plan:  Assessment: 1. Anxiety 2. Tension headaches 3. Weight Gain  Plan: Continue current medications. Encouraged healthy diet, exercise 30 minutes 4 days a week. Followup with patient in 6 months and sooner as needed.

## 2013-09-25 ENCOUNTER — Telehealth: Payer: Self-pay | Admitting: Family

## 2013-09-25 NOTE — Telephone Encounter (Signed)
[facility] Triage Call Report Triage Record Num: [id number] Operator: Virginia Chavez Patient Name: Virginia Chavez Call Date & Time: 09/24/2013 5:35:29PM Patient Phone: PCP: Virginia Chavez Patient Gender: Female PCP Fax : Patient DOB: 01/28/1986 Practice Name: [facility]  Reason for Call: Pt has Implanon, irregular cycles. Denies Preg or BF. Caller: Virginia Chavez/Patient; PCP: Virginia Mangoampbell, Padonda (Family Practice); CB#: [phone]; Call regarding Headache. Onset 09/18/13. Ibuprofen 800mg  helping some. Taking 2 x day. Fatigue noted. New light sensitivity without recent MD evaluation. See ED now. Care advice given per Headache Protocol. Pt will go to UC or ED tonight 09/24/13. Protocol(s) Used: Headache Recommended Outcome per Protocol: See ED Immediately Reason for Outcome: Sudden loss or change in vision (double or blurred vision, increased light sensitivity, partial loss of visual field) AND not previously evaluated Care Advice: ~ Protect the patient from falling or other harm. ~ Another adult should drive. ~ Wear dark UV-blocking sunglasses to protect eyes from sun exposure or for light sensitivity. ~ IMMEDIATE ACTION Write down provider's name. List or place the following in a bag for transport with the patient: current prescription and/or nonprescription medications; alternative treatments, therapies and medications; and street drugs. ~ 02/

## 2013-09-26 ENCOUNTER — Encounter: Payer: Self-pay | Admitting: Family

## 2013-09-26 ENCOUNTER — Ambulatory Visit (INDEPENDENT_AMBULATORY_CARE_PROVIDER_SITE_OTHER): Payer: BC Managed Care – PPO | Admitting: Family

## 2013-09-26 VITALS — BP 108/62 | HR 87 | Wt 200.0 lb

## 2013-09-26 DIAGNOSIS — F411 Generalized anxiety disorder: Secondary | ICD-10-CM

## 2013-09-26 DIAGNOSIS — R51 Headache: Secondary | ICD-10-CM

## 2013-09-26 DIAGNOSIS — R635 Abnormal weight gain: Secondary | ICD-10-CM

## 2013-09-26 MED ORDER — BUPROPION HCL ER (XL) 150 MG PO TB24: 150.0000 mg | ORAL_TABLET | Freq: Every day | ORAL | Status: DC

## 2013-09-26 NOTE — Progress Notes (Signed)
Pre visit review using our clinic review tool, if applicable. No additional management support is needed unless otherwise documented below in the visit note. 

## 2013-09-26 NOTE — Progress Notes (Signed)
   Subjective:    Patient ID: Virginia NeerBernice R Chavez, female    DOB: 05/10/1986, 28 y.o.   MRN: [id number]  HPI 28 year old African American female, nonsmoker with a history of anxiety he is in as an urgent care followup after being seen for headaches on 09/25/13. She reported to urgent care clinic with a severe headache and was prescribed soma and mobic. She reports meloxicam worked well. However, the soma made her loopy. In October she was prescribed Wellbutrin for management of stress and anxiety that she has discontinued. 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 She continues to care for her 418-year-old autistic son and her sister. Weight is up 20 pounds from October. No exercise.    Review of Systems  Constitutional: Negative.   HENT: Negative.   Respiratory: Negative.   Cardiovascular: Negative.   Endocrine: Negative.   Genitourinary: Negative.   Musculoskeletal: Negative.   Skin: Negative.   Neurological: Positive for numbness. Negative for dizziness and facial asymmetry.  Psychiatric/Behavioral: The patient is nervous/anxious.    History reviewed. No pertinent past medical history.  History   Social History  . Marital Status: Single    Spouse Name: N/A    Number of Children: N/A  . Years of Education: N/A   Occupational History  . Not on file.   Social History Main Topics  . Smoking status: Never Smoker   . Smokeless tobacco: Not on file  . Alcohol Use: Yes     Comment: socially  . Drug Use: No  . Sexual Activity: Not on file   Other Topics Concern  . Not on file   Social History Narrative  . No narrative on file    History reviewed. No pertinent past surgical history.  No family history on file.  No Known Allergies  No current outpatient prescriptions on file prior to visit.   No current facility-administered medications on file prior to visit.    BP 108/62  Pulse 87  Wt 200 lb (90.719 kg)chart   Objective:   Physical Exam  Constitutional: She is oriented to person, place, and time. She appears well-developed and well-nourished.  HENT:  Right Ear: External ear normal.  Nose: Nose normal.  Mouth/Throat: Oropharynx is clear and moist.  Neck: Normal range of motion. Neck supple.  Fullness of the neck  Cardiovascular: Normal rate, regular rhythm and normal heart sounds.   Pulmonary/Chest: Effort normal and breath sounds normal.  Musculoskeletal: Normal range of motion.  Neurological: She is alert and oriented to person, place, and time. She has normal reflexes.  Skin: Skin is warm and dry.  Psychiatric: She has a normal mood and affect.          Assessment & Plan:  Virginia Chavez was seen today for follow-up.  Diagnoses and associated orders for this visit:  Weight gain - TSH - T3 - T4  Anxiety state, unspecified  Headache(784.0)  Other Orders - buPROPion (WELLBUTRIN XL) 150 MG 24 hr tablet; Take 1 tablet (150 mg total) by mouth daily.     Return for a recheck in 4 weeks and sooner as needed. Resume Wellbutrin and Mobic as needed for headache. Discontinue Ibuprofen.

## 2013-09-26 NOTE — Patient Instructions (Signed)

## 2013-09-27 LAB — T4: T4 TOTAL: 7.9 ug/dL (ref 5.0–12.5)

## 2013-09-27 LAB — T3: T3 TOTAL: 95 ng/dL (ref 80.0–204.0)

## 2013-09-29 LAB — TSH: TSH: 0.64 u[IU]/mL (ref 0.35–5.50)

## 2013-10-24 ENCOUNTER — Ambulatory Visit: Payer: BC Managed Care – PPO | Admitting: Family

## 2013-12-16 ENCOUNTER — Other Ambulatory Visit: Payer: Self-pay | Admitting: Family

## 2013-12-22 ENCOUNTER — Ambulatory Visit: Payer: BC Managed Care – PPO | Admitting: Family

## 2013-12-22 DIAGNOSIS — Z0289 Encounter for other administrative examinations: Secondary | ICD-10-CM

## 2014-06-22 ENCOUNTER — Encounter: Payer: Self-pay | Admitting: Family

## 2014-07-26 ENCOUNTER — Other Ambulatory Visit: Payer: Self-pay | Admitting: Family

## 2015-07-27 ENCOUNTER — Encounter: Payer: Self-pay | Admitting: Adult Health

## 2015-07-27 ENCOUNTER — Ambulatory Visit (INDEPENDENT_AMBULATORY_CARE_PROVIDER_SITE_OTHER): Payer: BLUE CROSS/BLUE SHIELD | Admitting: Adult Health

## 2015-07-27 VITALS — BP 120/84 | Temp 98.1°F | Ht 65.0 in | Wt 179.1 lb

## 2015-07-27 DIAGNOSIS — F411 Generalized anxiety disorder: Secondary | ICD-10-CM

## 2015-07-27 MED ORDER — CITALOPRAM HYDROBROMIDE 20 MG PO TABS: 20.0000 mg | ORAL_TABLET | Freq: Every day | ORAL | Status: DC

## 2015-07-27 NOTE — Patient Instructions (Addendum)
It was a pleasure meeting you today!  Please follow up with me in 30 days for a follow up visit after starting Celexa.   Take this medication every day. Please let me know if you need anything.

## 2015-07-27 NOTE — Progress Notes (Signed)
   Subjective:    Patient ID: Virginia Chavez, female    DOB: 02/28/1986, 29 y.o.   MRN: [id number]  HPI  [id number] 29 year old female who presents to the office today for follow up regarding Wellbutrin. She has not been taking this medication for the last month. She is having anxiety from work and would like to be on something other than Wellbutrin. She denies any depression  Review of Systems  Constitutional: Negative.   HENT: Negative.   Eyes: Negative.   Respiratory: Negative.   Cardiovascular: Negative.   Gastrointestinal: Negative.   Endocrine: Negative.   Genitourinary: Negative.   Musculoskeletal: Negative.   Skin: Negative.   Allergic/Immunologic: Negative.   Hematological: Negative.   Psychiatric/Behavioral: Negative.   All other systems reviewed and are negative.  No past medical history on file.  Social History   Social History  . Marital Status: Single    Spouse Name: N/A  . Number of Children: N/A  . Years of Education: N/A   Occupational History  . Not on file.   Social History Main Topics  . Smoking status: Never Smoker   . Smokeless tobacco: Not on file  . Alcohol Use: Yes     Comment: socially  . Drug Use: No  . Sexual Activity: Not on file   Other Topics Concern  . Not on file   Social History Narrative    No past surgical history on file.  No family history on file.  No Known Allergies  Current Outpatient Prescriptions on File Prior to Visit  Medication Sig Dispense Refill  . carisoprodol (SOMA) 350 MG tablet Take 350 mg by mouth 3 (three) times daily as needed for muscle spasms.    . meloxicam (MOBIC) 15 MG tablet Take 15 mg by mouth daily.     No current facility-administered medications on file prior to visit.    BP 120/84 mmHg  Temp(Src) 98.1 F (36.7 C) (Oral)  Ht 5\' 5"  (1.651 m)  Wt 179 lb 1.6 oz (81.239 kg)  BMI 29.80 kg/m2  LMP 04/22/2015       Objective:   Physical Exam  Constitutional: She is oriented to person, place,  and time. She appears well-developed and well-nourished. No distress.  Cardiovascular: Normal rate, regular rhythm, normal heart sounds and intact distal pulses.  Exam reveals no gallop and no friction rub.   No murmur heard. Pulmonary/Chest: Effort normal and breath sounds normal. No respiratory distress. She has no wheezes. She has no rales. She exhibits no tenderness.  Musculoskeletal: Normal range of motion.  Neurological: She is alert and oriented to person, place, and time.  Skin: Skin is warm and dry. No rash noted. She is not diaphoretic. No erythema. No pallor.  Psychiatric: She has a normal mood and affect. Her behavior is normal. Judgment and thought content normal.  Nursing note and vitals reviewed.      Assessment & Plan:  1. Generalized anxiety disorder - citalopram (CELEXA) 20 MG tablet; Take 1 tablet (20 mg total) by mouth daily.  Dispense: 30 tablet; Refill: 3  - follow up in one month or sooner if needed.  -

## 2015-08-27 ENCOUNTER — Ambulatory Visit: Payer: BLUE CROSS/BLUE SHIELD | Admitting: Adult Health

## 2015-08-30 ENCOUNTER — Ambulatory Visit: Payer: BLUE CROSS/BLUE SHIELD | Admitting: Adult Health

## 2015-09-03 ENCOUNTER — Ambulatory Visit: Payer: BLUE CROSS/BLUE SHIELD | Admitting: Adult Health

## 2015-09-30 ENCOUNTER — Encounter: Payer: Self-pay | Admitting: Adult Health

## 2015-09-30 ENCOUNTER — Ambulatory Visit (INDEPENDENT_AMBULATORY_CARE_PROVIDER_SITE_OTHER): Payer: BLUE CROSS/BLUE SHIELD | Admitting: Adult Health

## 2015-09-30 VITALS — BP 114/78 | Temp 98.3°F | Ht 65.0 in | Wt 175.3 lb

## 2015-09-30 DIAGNOSIS — F411 Generalized anxiety disorder: Secondary | ICD-10-CM | POA: Diagnosis not present

## 2015-09-30 MED ORDER — CITALOPRAM HYDROBROMIDE 40 MG PO TABS: 40.0000 mg | ORAL_TABLET | Freq: Every day | ORAL | Status: AC

## 2015-09-30 NOTE — Patient Instructions (Signed)
It was great seeing you again  I have sent in a prescription for Celexa 40 mg. Please let me know how you are feeling in a month or so  Follow up with therapy.   Start walking, yoga, deep breathing, etc to help with anxiety.   If you need anything, please let me know.    [facility].  Address: [address] [place], [place], [place] [address] Phone: [phone] [place]

## 2015-09-30 NOTE — Progress Notes (Signed)
Pre visit review using our clinic review tool, if applicable. No additional management support is needed unless otherwise documented below in the visit note. 

## 2015-09-30 NOTE — Progress Notes (Signed)
   Subjective:    Patient ID: Virginia Chavez, female    DOB: Apr 25, 1986, 30 y.o.   MRN: [id number]  HPI  30 year old female who presents to the office today for follow up regarding her anxiety. I last saw her in December and was started on Celexa 20 mg for anxiety and panic attacks.   She reports that she has not noticed much effect since starting this medication. She does not necessarily want to go on another medication  She continues to feel anxious and feeling of having her heart racing. Her anxiety stems a lot from work and when her son " works me up and I need to keep calm for him because he is on a spectrum."   She is not exercising as she wishes she was  Review of Systems  Constitutional: Negative.   Psychiatric/Behavioral: Negative for suicidal ideas, sleep disturbance, self-injury and agitation. The patient is nervous/anxious and is hyperactive.   All other systems reviewed and are negative.  No past medical history on file.  Social History   Social History  . Marital Status: Single    Spouse Name: N/A  . Number of Children: N/A  . Years of Education: N/A   Occupational History  . Not on file.   Social History Main Topics  . Smoking status: Never Smoker   . Smokeless tobacco: Not on file  . Alcohol Use: Yes     Comment: socially  . Drug Use: No  . Sexual Activity: Not on file   Other Topics Concern  . Not on file   Social History Narrative    No past surgical history on file.  No family history on file.  No Known Allergies  Current Outpatient Prescriptions on File Prior to Visit  Medication Sig Dispense Refill  . carisoprodol (SOMA) 350 MG tablet Take 350 mg by mouth 3 (three) times daily as needed for muscle spasms. Reported on 09/30/2015    . meloxicam (MOBIC) 15 MG tablet Take 15 mg by mouth daily. Reported on 09/30/2015     No current facility-administered medications on file prior to visit.    BP 114/78 mmHg  Temp(Src) 98.3 F (36.8 C) (Oral)   Ht  (1.651 m)  Wt 175 lb 4.8 oz (79.516 kg)  BMI 29.17 kg/m2  LMP 08/30/2015       Objective:   Physical Exam  Constitutional: She is oriented to person, place, and time. She appears well-developed and well-nourished. No distress.  Neurological: She is alert and oriented to person, place, and time.  Skin: Skin is warm and dry. No rash noted. She is not diaphoretic. No erythema. No pallor.  Psychiatric: She has a normal mood and affect. Her behavior is normal. Judgment and thought content normal.  Nursing note and vitals reviewed.     Assessment & Plan:  1. Generalized anxiety disorder - citalopram (CELEXA) 40 MG tablet; Take 1 tablet (40 mg total) by mouth daily.  Dispense: 30 tablet; Refill: 3 - List of therapist was given  - Increase aerobic exercise, walking, yoga, deep breathing, etc.  - Follow up in one year.

## 2018-07-26 ENCOUNTER — Encounter (HOSPITAL_BASED_OUTPATIENT_CLINIC_OR_DEPARTMENT_OTHER): Payer: Self-pay | Admitting: *Deleted

## 2018-07-26 ENCOUNTER — Other Ambulatory Visit: Payer: Self-pay

## 2018-07-26 NOTE — Progress Notes (Signed)
Message left for Dr. Charlesetta Garibaldiruesdale's office to call to discuss pts dermal piercing. Spoke with Dr. Mal AmabileBrock who suggests removal but if unable to do so use of bipolar bovie for surgical case.

## 2018-07-26 NOTE — Progress Notes (Signed)
Made pt aware that a call has been placed regarding piercing and she needs to call Dr. Leonel Ramsayruesdales office if she has not heard back by Thursday December 13.

## 2018-08-05 ENCOUNTER — Ambulatory Visit (HOSPITAL_BASED_OUTPATIENT_CLINIC_OR_DEPARTMENT_OTHER)
Admission: RE | Admit: 2018-08-05 | Discharge: 2018-08-05 | Disposition: A | Discharge status: Home / Self Care | Payer: Managed Care, Other (non HMO) | Attending: Specialist | Admitting: Specialist

## 2018-08-05 ENCOUNTER — Other Ambulatory Visit: Payer: Self-pay

## 2018-08-05 ENCOUNTER — Encounter (HOSPITAL_BASED_OUTPATIENT_CLINIC_OR_DEPARTMENT_OTHER)
Admission: RE | Disposition: A | Discharge status: Home / Self Care | Payer: Self-pay | Source: Home / Self Care | Attending: Specialist

## 2018-08-05 ENCOUNTER — Encounter (HOSPITAL_BASED_OUTPATIENT_CLINIC_OR_DEPARTMENT_OTHER): Payer: Self-pay | Admitting: *Deleted

## 2018-08-05 ENCOUNTER — Ambulatory Visit (HOSPITAL_BASED_OUTPATIENT_CLINIC_OR_DEPARTMENT_OTHER): Payer: Managed Care, Other (non HMO) | Admitting: Certified Registered Nurse Anesthetist

## 2018-08-05 DIAGNOSIS — Z791 Long term (current) use of non-steroidal anti-inflammatories (NSAID): Secondary | ICD-10-CM | POA: Diagnosis not present

## 2018-08-05 DIAGNOSIS — Z79899 Other long term (current) drug therapy: Secondary | ICD-10-CM | POA: Diagnosis not present

## 2018-08-05 DIAGNOSIS — L304 Erythema intertrigo: Secondary | ICD-10-CM | POA: Diagnosis not present

## 2018-08-05 DIAGNOSIS — Z87891 Personal history of nicotine dependence: Secondary | ICD-10-CM | POA: Diagnosis not present

## 2018-08-05 DIAGNOSIS — N62 Hypertrophy of breast: Secondary | ICD-10-CM | POA: Insufficient documentation

## 2018-08-05 HISTORY — PX: BREAST REDUCTION SURGERY: SHX8

## 2018-08-05 LAB — POCT PREGNANCY, URINE: Preg Test, Ur: NEGATIVE

## 2018-08-05 SURGERY — MAMMOPLASTY, REDUCTION
Anesthesia: General | Site: Breast | Laterality: Bilateral

## 2018-08-05 MED ORDER — PROPOFOL 10 MG/ML IV BOLUS
INTRAVENOUS | Status: DC | PRN
  Administered 2018-08-05: 200 mg via INTRAVENOUS

## 2018-08-05 MED ORDER — LIDOCAINE HCL 2 % IJ SOLN
INTRAVENOUS | Status: DC | PRN
  Administered 2018-08-05: 1000 mL

## 2018-08-05 MED ORDER — FENTANYL CITRATE (PF) 100 MCG/2ML IJ SOLN
INTRAMUSCULAR | Status: AC
  Filled 2018-08-05: qty 2

## 2018-08-05 MED ORDER — ONDANSETRON HCL 4 MG/2ML IJ SOLN
INTRAMUSCULAR | Status: DC | PRN
  Administered 2018-08-05: 4 mg via INTRAVENOUS

## 2018-08-05 MED ORDER — LIDOCAINE HCL 2 % IJ SOLN
INTRAMUSCULAR | Status: AC
  Filled 2018-08-05: qty 100

## 2018-08-05 MED ORDER — SCOPOLAMINE 1 MG/3DAYS TD PT72: 1.0000 | MEDICATED_PATCH | Freq: Once | TRANSDERMAL | Status: DC | PRN

## 2018-08-05 MED ORDER — CHLORHEXIDINE GLUCONATE CLOTH 2 % EX PADS: 6.0000 | MEDICATED_PAD | Freq: Once | CUTANEOUS | Status: DC

## 2018-08-05 MED ORDER — CEFAZOLIN SODIUM-DEXTROSE 2-4 GM/100ML-% IV SOLN
2.0000 g | INTRAVENOUS | Status: AC
  Administered 2018-08-05: 2 g via INTRAVENOUS

## 2018-08-05 MED ORDER — HYDROMORPHONE HCL 1 MG/ML IJ SOLN
INTRAMUSCULAR | Status: AC
  Filled 2018-08-05: qty 0.5

## 2018-08-05 MED ORDER — ROCURONIUM BROMIDE 50 MG/5ML IV SOSY
PREFILLED_SYRINGE | INTRAVENOUS | Status: AC
  Filled 2018-08-05: qty 5

## 2018-08-05 MED ORDER — MIDAZOLAM HCL 2 MG/2ML IJ SOLN: 1.0000 mg | INTRAMUSCULAR | Status: DC | PRN

## 2018-08-05 MED ORDER — DEXAMETHASONE SODIUM PHOSPHATE 10 MG/ML IJ SOLN
INTRAMUSCULAR | Status: AC
  Filled 2018-08-05: qty 1

## 2018-08-05 MED ORDER — MIDAZOLAM HCL 2 MG/2ML IJ SOLN
INTRAMUSCULAR | Status: AC
  Filled 2018-08-05: qty 2

## 2018-08-05 MED ORDER — MIDAZOLAM HCL 2 MG/2ML IJ SOLN: 0.5000 mg | Freq: Once | INTRAMUSCULAR | Status: DC | PRN

## 2018-08-05 MED ORDER — ONDANSETRON HCL 4 MG/2ML IJ SOLN
INTRAMUSCULAR | Status: AC
  Filled 2018-08-05: qty 2

## 2018-08-05 MED ORDER — SUGAMMADEX SODIUM 200 MG/2ML IV SOLN
INTRAVENOUS | Status: AC
  Filled 2018-08-05: qty 2

## 2018-08-05 MED ORDER — DEXAMETHASONE SODIUM PHOSPHATE 4 MG/ML IJ SOLN
INTRAMUSCULAR | Status: DC | PRN
  Administered 2018-08-05: 10 mg via INTRAVENOUS

## 2018-08-05 MED ORDER — PROMETHAZINE HCL 25 MG/ML IJ SOLN: 6.2500 mg | INTRAMUSCULAR | Status: DC | PRN

## 2018-08-05 MED ORDER — FENTANYL CITRATE (PF) 100 MCG/2ML IJ SOLN: 50.0000 ug | INTRAMUSCULAR | Status: DC | PRN

## 2018-08-05 MED ORDER — GLYCOPYRROLATE 0.2 MG/ML IJ SOLN
INTRAMUSCULAR | Status: DC | PRN
  Administered 2018-08-05: 0.2 mg via INTRAVENOUS

## 2018-08-05 MED ORDER — PHENYLEPHRINE HCL 10 MG/ML IJ SOLN
INTRAMUSCULAR | Status: DC | PRN
  Administered 2018-08-05 (×3): 40 ug via INTRAVENOUS

## 2018-08-05 MED ORDER — FENTANYL CITRATE (PF) 100 MCG/2ML IJ SOLN
INTRAMUSCULAR | Status: DC | PRN
  Administered 2018-08-05: 50 ug via INTRAVENOUS
  Administered 2018-08-05 (×3): 25 ug via INTRAVENOUS
  Administered 2018-08-05 (×2): 50 ug via INTRAVENOUS
  Administered 2018-08-05: 25 ug via INTRAVENOUS
  Administered 2018-08-05: 50 ug via INTRAVENOUS

## 2018-08-05 MED ORDER — SUGAMMADEX SODIUM 200 MG/2ML IV SOLN
INTRAVENOUS | Status: DC | PRN
  Administered 2018-08-05: 165.4 mg via INTRAVENOUS

## 2018-08-05 MED ORDER — LACTATED RINGERS IV SOLN
INTRAVENOUS | Status: DC
  Administered 2018-08-05 (×2): via INTRAVENOUS

## 2018-08-05 MED ORDER — SCOPOLAMINE 1 MG/3DAYS TD PT72: 1.0000 | MEDICATED_PATCH | Freq: Once | TRANSDERMAL | Status: DC

## 2018-08-05 MED ORDER — CEFAZOLIN SODIUM-DEXTROSE 2-4 GM/100ML-% IV SOLN
INTRAVENOUS | Status: AC
  Filled 2018-08-05: qty 100

## 2018-08-05 MED ORDER — LIDOCAINE HCL (CARDIAC) PF 100 MG/5ML IV SOSY
PREFILLED_SYRINGE | INTRAVENOUS | Status: DC | PRN
  Administered 2018-08-05: 80 mg via INTRAVENOUS

## 2018-08-05 MED ORDER — MIDAZOLAM HCL 5 MG/5ML IJ SOLN
INTRAMUSCULAR | Status: DC | PRN
  Administered 2018-08-05: 2 mg via INTRAVENOUS

## 2018-08-05 MED ORDER — EPINEPHRINE 30 MG/30ML IJ SOLN
INTRAMUSCULAR | Status: AC
  Filled 2018-08-05: qty 1

## 2018-08-05 MED ORDER — PROPOFOL 10 MG/ML IV BOLUS
INTRAVENOUS | Status: AC
  Filled 2018-08-05: qty 20

## 2018-08-05 MED ORDER — LIDOCAINE 2% (20 MG/ML) 5 ML SYRINGE
INTRAMUSCULAR | Status: AC
  Filled 2018-08-05: qty 5

## 2018-08-05 MED ORDER — GLYCOPYRROLATE PF 0.2 MG/ML IJ SOSY
PREFILLED_SYRINGE | INTRAMUSCULAR | Status: AC
  Filled 2018-08-05: qty 1

## 2018-08-05 MED ORDER — SCOPOLAMINE 1 MG/3DAYS TD PT72
MEDICATED_PATCH | TRANSDERMAL | Status: DC | PRN
  Administered 2018-08-05: 1 via TRANSDERMAL

## 2018-08-05 MED ORDER — MEPERIDINE HCL 25 MG/ML IJ SOLN: 6.2500 mg | INTRAMUSCULAR | Status: DC | PRN

## 2018-08-05 MED ORDER — SCOPOLAMINE 1 MG/3DAYS TD PT72
MEDICATED_PATCH | TRANSDERMAL | Status: AC
  Filled 2018-08-05: qty 1

## 2018-08-05 MED ORDER — ROCURONIUM BROMIDE 100 MG/10ML IV SOLN
INTRAVENOUS | Status: DC | PRN
  Administered 2018-08-05: 50 mg via INTRAVENOUS

## 2018-08-05 MED ORDER — HYDROMORPHONE HCL 1 MG/ML IJ SOLN
0.2500 mg | INTRAMUSCULAR | Status: DC | PRN
  Administered 2018-08-05 (×2): 0.5 mg via INTRAVENOUS

## 2018-08-05 SURGICAL SUPPLY — 68 items
BAG DECANTER FOR FLEXI CONT (MISCELLANEOUS) IMPLANT
BENZOIN TINCTURE PRP APPL 2/3 (GAUZE/BANDAGES/DRESSINGS) ×4 IMPLANT
BINDER BREAST XLRG (GAUZE/BANDAGES/DRESSINGS) ×2 IMPLANT
BINDER BREAST XXLRG (GAUZE/BANDAGES/DRESSINGS) IMPLANT
BLADE KNIFE PERSONA 10 (BLADE) IMPLANT
BLADE KNIFE PERSONA 15 (BLADE) IMPLANT
BLADE SURG 10 STRL SS (BLADE) ×8 IMPLANT
BLADE SURG 15 STRL LF DISP TIS (BLADE) ×2 IMPLANT
BLADE SURG 15 STRL SS (BLADE) ×2
CANISTER SUCT 1200ML W/VALVE (MISCELLANEOUS) ×2 IMPLANT
COVER BACK TABLE 60X90IN (DRAPES) ×2 IMPLANT
COVER MAYO STAND STRL (DRAPES) ×2 IMPLANT
COVER WAND RF STERILE (DRAPES) IMPLANT
DECANTER SPIKE VIAL GLASS SM (MISCELLANEOUS) IMPLANT
DRAIN CHANNEL 10F 3/8 F FF (DRAIN) ×4 IMPLANT
DRAPE LAPAROSCOPIC ABDOMINAL (DRAPES) ×2 IMPLANT
DRSG PAD ABDOMINAL 8X10 ST (GAUZE/BANDAGES/DRESSINGS) ×8 IMPLANT
ELECT REM PT RETURN 9FT ADLT (ELECTROSURGICAL) ×2
ELECTRODE REM PT RTRN 9FT ADLT (ELECTROSURGICAL) ×1 IMPLANT
EVACUATOR SILICONE 100CC (DRAIN) ×4 IMPLANT
GAUZE SPONGE 4X4 12PLY STRL (GAUZE/BANDAGES/DRESSINGS) ×4 IMPLANT
GAUZE XEROFORM 5X9 LF (GAUZE/BANDAGES/DRESSINGS) ×4 IMPLANT
GLOVE BIO SURGEON STRL SZ 6.5 (GLOVE) ×4 IMPLANT
GLOVE BIOGEL M STRL SZ7.5 (GLOVE) IMPLANT
GLOVE BIOGEL PI IND STRL 8 (GLOVE) ×2 IMPLANT
GLOVE BIOGEL PI INDICATOR 8 (GLOVE) ×2
GLOVE ECLIPSE 7.0 STRL STRAW (GLOVE) ×2 IMPLANT
GLOVE SURG SYN 8.0 (GLOVE) ×4 IMPLANT
GOWN STRL REIN XL XLG (GOWN DISPOSABLE) ×4 IMPLANT
GOWN STRL REUS W/ TWL XL LVL3 (GOWN DISPOSABLE) ×1 IMPLANT
GOWN STRL REUS W/TWL XL LVL3 (GOWN DISPOSABLE) ×1
IV NS 500ML (IV SOLUTION)
IV NS 500ML BAXH (IV SOLUTION) IMPLANT
MARKER SKIN DUAL TIP RULER LAB (MISCELLANEOUS) ×2 IMPLANT
NEEDLE SPNL 18GX3.5 QUINCKE PK (NEEDLE) IMPLANT
NS IRRIG 1000ML POUR BTL (IV SOLUTION) ×4 IMPLANT
PACK BASIN DAY SURGERY FS (CUSTOM PROCEDURE TRAY) ×2 IMPLANT
PAD ALCOHOL SWAB (MISCELLANEOUS) ×2 IMPLANT
PEN SKIN MARKING BROAD TIP (MISCELLANEOUS) ×2 IMPLANT
PILLOW FOAM RUBBER ADULT (PILLOWS) IMPLANT
PIN SAFETY STERILE (MISCELLANEOUS) ×2 IMPLANT
SHEETING SILICONE GEL EPI DERM (MISCELLANEOUS) ×2 IMPLANT
SLEEVE SCD COMPRESS KNEE MED (MISCELLANEOUS) ×2 IMPLANT
SPECIMEN JAR MEDIUM (MISCELLANEOUS) IMPLANT
SPECIMEN JAR X LARGE (MISCELLANEOUS) ×4 IMPLANT
SPONGE LAP 18X18 RF (DISPOSABLE) ×8 IMPLANT
STRIP SUTURE WOUND CLOSURE 1/2 (SUTURE) ×14 IMPLANT
SUT MNCRL AB 3-0 PS2 18 (SUTURE) ×12 IMPLANT
SUT MON AB 2-0 CT1 36 (SUTURE) IMPLANT
SUT MON AB 5-0 PS2 18 (SUTURE) ×4 IMPLANT
SUT PROLENE 3 0 PS 2 (SUTURE) ×12 IMPLANT
SUT VLOC 180 0 24IN GS25 (SUTURE) IMPLANT
SUT VLOC 90 P-14 23 (SUTURE) ×6 IMPLANT
SYR 50ML LL SCALE MARK (SYRINGE) ×2 IMPLANT
SYR CONTROL 10ML LL (SYRINGE) IMPLANT
SYR TB 1ML LL NO SAFETY (SYRINGE) ×2 IMPLANT
TAPE HYPAFIX 6X30 (GAUZE/BANDAGES/DRESSINGS) ×2 IMPLANT
TAPE MEASURE 72IN RETRACT (INSTRUMENTS) ×1
TAPE MEASURE LINEN 72IN RETRCT (INSTRUMENTS) ×1 IMPLANT
TAPE PAPER 1X10 WHT MICROPORE (GAUZE/BANDAGES/DRESSINGS) ×2 IMPLANT
TOWEL GREEN STERILE FF (TOWEL DISPOSABLE) ×4 IMPLANT
TOWEL OR NON WOVEN STRL DISP B (DISPOSABLE) IMPLANT
TUBE CONNECTING 20X1/4 (TUBING) ×2 IMPLANT
TUBING INFILTRATION IT-10001 (TUBING) ×2 IMPLANT
TUBING SET GRADUATE ASPIR 12FT (MISCELLANEOUS) ×2 IMPLANT
UNDERPAD 30X30 (UNDERPADS AND DIAPERS) ×4 IMPLANT
VAC PENCILS W/TUBING CLEAR (MISCELLANEOUS) ×2 IMPLANT
YANKAUER SUCT BULB TIP NO VENT (SUCTIONS) ×2 IMPLANT

## 2018-08-05 NOTE — H&P (Signed)
Virginia Chavez is an 32 y.o. female.   Chief Complaint: Increased macromastia HPI: Increased back and shouldre pain intertrigo  History reviewed. No pertinent past medical history.  History reviewed. No pertinent surgical history.  Family History  Problem Relation Age of Onset  . Hypertension Father    Social History:  reports that she has never smoked. She has quit using smokeless tobacco. She reports current alcohol use. She reports current drug use. Drug: Marijuana.  Allergies: No Known Allergies  Medications Prior to Admission  Medication Sig Dispense Refill  . ADVANCED EVENING PRIMROSE OIL PO Take by mouth.    . cholecalciferol (VITAMIN D3) 25 MCG (1000 UT) tablet Take 1,000 Units by mouth daily.    Chilton Si. Green Tea 150 MG CAPS Take by mouth.    . vitamin B-12 (CYANOCOBALAMIN) 100 MCG tablet Take 100 mcg by mouth daily.    . carisoprodol (SOMA) 350 MG tablet Take 350 mg by mouth 3 (three) times daily as needed for muscle spasms. Reported on 09/30/2015    . citalopram (CELEXA) 40 MG tablet Take 1 tablet (40 mg total) by mouth daily. 30 tablet 3  . meloxicam (MOBIC) 15 MG tablet Take 15 mg by mouth daily. Reported on 09/30/2015      Results for orders placed or performed during the hospital encounter of 08/05/18 (from the past 48 hour(s))  Pregnancy, urine POC     Status: None   Collection Time: 08/05/18  7:22 AM  Result Value Ref Range   Preg Test, Ur NEGATIVE NEGATIVE    Comment:        THE SENSITIVITY OF THIS METHODOLOGY IS >24 mIU/mL    No results found.  Review of Systems  Constitutional: Negative.   HENT: Negative.   Eyes: Negative.   Respiratory: Negative.   Cardiovascular: Negative.   Gastrointestinal: Negative.   Genitourinary: Negative.   Musculoskeletal: Positive for back pain, myalgias and neck pain.  Skin: Positive for rash.  Neurological: Negative.   Endo/Heme/Allergies: Negative.   Psychiatric/Behavioral: Negative.     Blood pressure (!) 118/57, pulse  77, temperature 98.5 F (36.9 C), temperature source Oral, resp. rate 18, height 5\' 5"  (1.651 m), weight 82.7 kg, last menstrual period 07/27/2018, SpO2 100 %. Physical Exam  Constitutional: She appears well-developed and well-nourished.  HENT:  Head: Normocephalic.  Eyes: Pupils are equal, round, and reactive to light.  Neck: Normal range of motion.  Cardiovascular: Normal rate and regular rhythm.  Respiratory: Effort normal.  GI: Soft.  Musculoskeletal: Normal range of motion.  Neurological: She is alert.  Skin: Skin is warm.  Psychiatric: She has a normal mood and affect.   severe macromastia and intertrigo  Assessment/Plan Severe macromastia for bilateral breast reductions  Vala Raffo L, MD 08/05/2018, 8:05 AM

## 2018-08-05 NOTE — Anesthesia Preprocedure Evaluation (Addendum)
Anesthesia Evaluation  Patient identified by MRN, date of birth, ID band Patient awake    Reviewed: Allergy & Precautions, NPO status , Patient's Chart, lab work & pertinent test results  History of Anesthesia Complications Negative for: history of anesthetic complications  Airway Mallampati: I  TM Distance: >3 FB Neck ROM: Full    Dental  (+) Dental Advisory Given, Teeth Intact   Pulmonary neg pulmonary ROS,    breath sounds clear to auscultation       Cardiovascular negative cardio ROS   Rhythm:Regular Rate:Normal     Neuro/Psych negative neurological ROS     GI/Hepatic negative GI ROS, Neg liver ROS,   Endo/Other  obese  Renal/GU negative Renal ROS     Musculoskeletal   Abdominal (+) + obese,   Peds  Hematology negative hematology ROS (+)   Anesthesia Other Findings   Reproductive/Obstetrics LMP 07/24/18                             Anesthesia Physical Anesthesia Plan  ASA: II  Anesthesia Plan: General   Post-op Pain Management:    Induction: Intravenous  PONV Risk Score and Plan: 4 or greater and Scopolamine patch - Pre-op, Dexamethasone and Ondansetron  Airway Management Planned: Oral ETT  Additional Equipment:   Intra-op Plan:   Post-operative Plan: Extubation in OR  Informed Consent: I have reviewed the patients History and Physical, chart, labs and discussed the procedure including the risks, benefits and alternatives for the proposed anesthesia with the patient or authorized representative who has indicated his/her understanding and acceptance.   Dental advisory given  Plan Discussed with: CRNA and Surgeon  Anesthesia Plan Comments: (Plan routine monitors, GETA)       Anesthesia Quick Evaluation

## 2018-08-05 NOTE — Anesthesia Postprocedure Evaluation (Signed)
Anesthesia Post Note  Patient: Virginia Chavez  Procedure(s) Performed: BILATERAL MAMMARY REDUCTION  (BREAST) (Bilateral Breast)     Patient location during evaluation: PACU Anesthesia Type: General Level of consciousness: sedated Pain management: pain level controlled Vital Signs Assessment: post-procedure vital signs reviewed and stable Respiratory status: spontaneous breathing and respiratory function stable Cardiovascular status: stable Postop Assessment: no apparent nausea or vomiting Anesthetic complications: no    Last Vitals:  Vitals:   08/05/18 1230 08/05/18 1239  BP: 116/74   Pulse:    Resp:    Temp:  36.7 C  SpO2:      Last Pain:  Vitals:   08/05/18 1304  TempSrc:   PainSc: 3                  Zahid Carneiro DANIEL

## 2018-08-05 NOTE — Anesthesia Procedure Notes (Signed)
Procedure Name: Intubation Date/Time: 08/05/2018 8:34 AM Performed by: Cleda ClarksBrowder, Hence Derrick R, CRNA Pre-anesthesia Checklist: Patient identified, Emergency Drugs available, Suction available and Patient being monitored Patient Re-evaluated:Patient Re-evaluated prior to induction Oxygen Delivery Method: Circle system utilized Preoxygenation: Pre-oxygenation with 100% oxygen Induction Type: IV induction Ventilation: Mask ventilation without difficulty Laryngoscope Size: Miller and 2 Grade View: Grade I Tube type: Oral Tube size: 7.0 mm Number of attempts: 1 Airway Equipment and Method: Stylet and Oral airway Placement Confirmation: ETT inserted through vocal cords under direct vision,  positive ETCO2 and breath sounds checked- equal and bilateral Secured at: 21 cm Tube secured with: Tape Dental Injury: Teeth and Oropharynx as per pre-operative assessment

## 2018-08-05 NOTE — Op Note (Signed)
NAME: Virginia Chavez, Virginia Chavez ACCOUNT [id number] DATE OF BIRTH:1985-10-02 FACILITY: [facility] LOCATION: [facility] PHYSICIAN:Odetta Forness Preston FleetingL. 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  History of intertriginous changes bilaterally, increased excessive breast tissue bilaterally.  PROCEDURES DONE:  Bilateral breast reductions using the inferior pedicle technique.  SURGEON:  Louisa SecondGerald Briarrose Shor  ANESTHESIA:  General.  DESCRIPTION OF PROCEDURE:  The procedure was  explained to the patient understands potential risk potentials and complications.  The patient consents to surgery.  The patient was drawn for the reduction mammoplasty marking the nipple-areolar complexes  from over 38 cm from the suprasternal notch to 22.  She then underwent general anesthesia, intubated orally.  Prep was done to the chest, breast areas in routine fashion using Hibiclens soap and solution, walled off with sterile towels and drapes so as  to make a sterile field.  Tumescent solution was injected in both breast, 500 cc per side.  This was allowed to sit.  The wounds were then scored with a #15 blade and the skin over the inferior pedicle was deepithelialized with a #20 blade.  Medial and  lateral fatty dermal pedicles were excised down to pectoralis major fascia.  Hemostasis was again maintained with Bovie on coagulation.  Next, a keyhole area was also debulked and out laterally, excessive amounts of excessive breast tissue was done  axillary and preaxillary areas, direct excision as well as liposuction assistance.  After this, the flaps were then freed and they were brought together and stay with 3-0 Prolene suture.  Subcutaneous closure was done with 3.0 Vitec subcutaneously and  subdermally throughout the inverted T.  The wounds were cleansed with a #10 fully  fluted Blake drain placed in the depths of the wound and brought out the lateral portion of the incision and secured with 3-0 Prolene.  I removed on the right side 1046  grams and on the left side 1070 grams.  At the end of the procedure, nipple areolar complexes were examined showing good suppleness and excellent blood supply.  The wounds were cleansed.  Steri-Strips and sterile dressings were applied as well as  silicone gel patches to the areas, 4 x 4 and ABDs, Hypafix tape.    She withstood the procedure very well.  ESTIMATED BLOOD LOSS:  Less than 100.  COMPLICATIONS:  None.  AN/NUANCE  D:08/05/2018 T:08/05/2018 JOB:[id number]

## 2018-08-05 NOTE — Transfer of Care (Signed)
Immediate Anesthesia Transfer of Care Note  Patient: Virginia Chavez  Procedure(s) Performed: BILATERAL MAMMARY REDUCTION  (BREAST) (Bilateral Breast)  Patient Location: PACU  Anesthesia Type:General  Level of Consciousness: awake, alert  and oriented  Airway & Oxygen Therapy: Patient Spontanous Breathing and Patient connected to face mask oxygen  Post-op Assessment: Report given to RN and Post -op Vital signs reviewed and stable  Post vital signs: Reviewed and stable  Last Vitals:  Vitals Value Taken Time  BP 129/60 08/05/2018 11:17 AM  Temp    Pulse 116 08/05/2018 11:18 AM  Resp 0 08/05/2018 11:18 AM  SpO2 100 % 08/05/2018 11:18 AM  Vitals shown include unvalidated device data.  Last Pain:  Vitals:   08/05/18 [id number]  TempSrc: Oral  PainSc: 0-No pain         Complications: No apparent anesthesia complications

## 2018-08-05 NOTE — Discharge Instructions (Signed)
Breast Reduction Care After Refer to this sheet in the next few weeks. These instructions provide you with information on caring for yourself after your procedure. Your caregiver may also give you more specific instructions. Your treatment has been planned according to current medical practices, but problems sometimes occur. Call your caregiver if you have any problems or questions after your procedure. HOME CARE INSTRUCTIONS  Do not lift more than 5 pounds with one arm, or 10 pounds with both arms, for 1 month.   Do not sleep on your stomach for 4 to 6 weeks.   Do not do vigorous exercise such as bouncing, aerobics, or jumping for 6 weeks. Walking is not restricted.   Do not drive while you are taking prescription pain medicine.   Avoid prolonged sun exposure.   Keep dressings dry and clean  Measure jp drainage every 12 hrs and measure   You may slowly go back to your normal diet. Start with a light meal and increase as comfortable.   You may shower 24 hours after your drains are removed unless instructed differently by your caregiver.   Take your pain medicine as prescribed. Discomfort is normal after breast reduction surgery.   Keep the head of your bed elevated 40 degrees   : Call the office if you notice:  You have a fever.   You notice drainage from the incision that smells bad.   You have persistent pain.   You have persistent bleeding from the incision or nipple discharge.   You develop increased swelling or swelling that is greater in one breast than in the other.  MAKE SURE YOU:   Understand these instructions.   Will watch your condition.   Will get help right away if you are not doing well or get worse.  Document Released: 03/21/2004 Document Revised: 04/19/2011 Document Reviewed: 10/31/2007 [facility] Patient Information 2012 [place], [place].      About my Jackson-Pratt Bulb Drain  What is a Jackson-Pratt bulb? A Jackson-Pratt is a soft, round device  used to collect drainage. It is connected to a long, thin drainage catheter, which is held in place by one or two small stiches near your surgical incision site. When the bulb is squeezed, it forms a vacuum, forcing the drainage to empty into the bulb.  Emptying the Jackson-Pratt bulb- To empty the bulb: 1. Release the plug on the top of the bulb. 2. Pour the bulb's contents into a measuring container which your nurse will provide. 3. Record the time emptied and amount of drainage. Empty the drain(s) as often as your     doctor or nurse recommends.  Date                  Time                    Amount (Drain 1)                 Amount (Drain 2)  _____________________________________________________________________  _____________________________________________________________________  _____________________________________________________________________  _____________________________________________________________________  _____________________________________________________________________  _____________________________________________________________________  _____________________________________________________________________  _____________________________________________________________________  Squeezing the Jackson-Pratt Bulb- To squeeze the bulb: 1. Make sure the plug at the top of the bulb is open. 2. Squeeze the bulb tightly in your fist. You will hear air squeezing from the bulb. 3. Replace the plug while the bulb is squeezed. 4. Use a safety pin to attach the bulb to your clothing. This will keep the catheter from     pulling at the  bulb insertion site.  When to call your doctor- Call your doctor if:  Drain site becomes red, swollen or hot.  You have a fever greater than 101 degrees F.  There is oozing at the drain site.  Drain falls out (apply a guaze bandage over the drain hole and secure it with tape).  Drainage increases daily not related to activity  patterns. (You will usually have more drainage when you are active than when you are resting.)  Drainage has a bad odor.      Post Anesthesia Home Care Instructions  Activity: Get plenty of rest for the remainder of the day. A responsible individual must stay with you for 24 hours following the procedure.  For the next 24 hours, DO NOT: -Drive a car -Advertising copywriter -Drink alcoholic beverages -Take any medication unless instructed by your physician -Make any legal decisions or sign important papers.  Meals: Start with liquid foods such as gelatin or soup. Progress to regular foods as tolerated. Avoid greasy, spicy, heavy foods. If nausea and/or vomiting occur, drink only clear liquids until the nausea and/or vomiting subsides. Call your physician if vomiting continues.  Special Instructions/Symptoms: Your throat may feel dry or sore from the anesthesia or the breathing tube placed in your throat during surgery. If this causes discomfort, gargle with warm salt water. The discomfort should disappear within 24 hours.  If you had a scopolamine patch placed behind your ear for the management of post- operative nausea and/or vomiting:  1. The medication in the patch is effective for 72 hours, after which it should be removed.  Wrap patch in a tissue and discard in the trash. Wash hands thoroughly with soap and water. 2. You may remove the patch earlier than 72 hours if you experience unpleasant side effects which may include dry mouth, dizziness or visual disturbances. 3. Avoid touching the patch. Wash your hands with soap and water after contact with the patch.    Post Anesthesia Home Care Instructions  Activity: Get plenty of rest for the remainder of the day. A responsible individual must stay with you for 24 hours following the procedure.  For the next 24 hours, DO NOT: -Drive a car -Advertising copywriter -Drink alcoholic beverages -Take any medication unless instructed by your  physician -Make any legal decisions or sign important papers.  Meals: Start with liquid foods such as gelatin or soup. Progress to regular foods as tolerated. Avoid greasy, spicy, heavy foods. If nausea and/or vomiting occur, drink only clear liquids until the nausea and/or vomiting subsides. Call your physician if vomiting continues.  Special Instructions/Symptoms: Your throat may feel dry or sore from the anesthesia or the breathing tube placed in your throat during surgery. If this causes discomfort, gargle with warm salt water. The discomfort should disappear within 24 hours.  If you had a scopolamine patch placed behind your ear for the management of post- operative nausea and/or vomiting:  1. The medication in the patch is effective for 72 hours, after which it should be removed.  Wrap patch in a tissue and discard in the trash. Wash hands thoroughly with soap and water. 2. You may remove the patch earlier than 72 hours if you experience unpleasant side effects which may include dry mouth, dizziness or visual disturbances. 3. Avoid touching the patch. Wash your hands with soap and water after contact with the patch.

## 2018-08-05 NOTE — Brief Op Note (Signed)
08/05/2018  10:59 AM  PATIENT:  Virginia Chavez  32 y.o. female  PRE-OPERATIVE DIAGNOSIS:  macromastia  POST-OPERATIVE DIAGNOSIS:  macromastia  PROCEDURE:  Procedure(s): BILATERAL MAMMARY REDUCTION  (BREAST) (Bilateral)  SURGEON:  Surgeon(s) and Role:    * Louisa Secondruesdale, Kasson Lamere, MD - Primary  PHYSICIAN ASSISTANT:   ASSISTANTs  ANESTHESIA:   general  EBL:  100 mL   BLOOD ADMINISTERED:none  DRAINS: (vv) Jackson-Pratt drain(s) with closed bulb suction in the 10 fully fluted   LOCAL MEDICATIONS USED:  LIDOCAINE   SPECIMEN:  Excision  DISPOSITION OF SPECIMEN:  PATHOLOGY  COUNTS:  YES  TOURNIQUET:  * No tourniquets in log *  DICTATION: .Other Dictation: Dictation Number [id number]  PLAN OF CARE: Discharge to home after PACU  PATIENT DISPOSITION:  PACU - hemodynamically stable.   Delay start of Pharmacological VTE agent (>24hrs) due to surgical blood loss or risk of bleeding: yes

## 2018-08-06 ENCOUNTER — Encounter (HOSPITAL_BASED_OUTPATIENT_CLINIC_OR_DEPARTMENT_OTHER): Payer: Self-pay | Admitting: Specialist

## 2019-11-06 ENCOUNTER — Ambulatory Visit: Payer: Self-pay | Attending: Family

## 2019-11-06 DIAGNOSIS — Z23 Encounter for immunization: Secondary | ICD-10-CM

## 2019-11-06 NOTE — Progress Notes (Signed)
   Covid-19 Vaccination Clinic  Name:  Virginia Chavez    MRN: [id number] DOB: [birth year]  11/06/2019  [name] was observed post Covid-19 immunization for 15 minutes without incident. She was provided with Vaccine Information Sheet and instruction to access the V-Safe system.   [name] was instructed to call 911 with any severe reactions post vaccine: Marland Kitchen Difficulty breathing  . Swelling of face and throat  . A fast heartbeat  . A bad rash all over body  . Dizziness and weakness   Immunizations Administered    Name Date Dose VIS Date Route   Moderna COVID-19 Vaccine 11/06/2019  3:49 PM 0.5 mL 07/22/2019 Intramuscular   Manufacturer: Moderna   Lot: [id number]   NDC: 36438-377-93

## 2019-12-09 ENCOUNTER — Ambulatory Visit: Payer: Self-pay | Attending: Family

## 2019-12-09 DIAGNOSIS — Z23 Encounter for immunization: Secondary | ICD-10-CM

## 2019-12-09 NOTE — Progress Notes (Signed)
   Covid-19 Vaccination Clinic  Name:  Virginia Chavez    MRN: [id number] DOB: [birth year]  12/09/2019  [name] was observed post Covid-19 immunization for 15 minutes without incident. She was provided with Vaccine Information Sheet and instruction to access the V-Safe system.   [name] was instructed to call 911 with any severe reactions post vaccine: Marland Kitchen Difficulty breathing  . Swelling of face and throat  . A fast heartbeat  . A bad rash all over body  . Dizziness and weakness   Immunizations Administered    Name Date Dose VIS Date Route   Moderna COVID-19 Vaccine 12/09/2019  2:47 PM 0.5 mL 07/2019 Intramuscular   Manufacturer: Moderna   Lot: [id number]   NDC: 45364-680-32

## 2020-01-10 LAB — GLUCOSE, POCT (MANUAL RESULT ENTRY): POC Glucose: 82 mg/dl (ref 70–99)

## 2020-04-13 ENCOUNTER — Other Ambulatory Visit: Payer: Self-pay

## 2020-04-13 DIAGNOSIS — Z20822 Contact with and (suspected) exposure to covid-19: Secondary | ICD-10-CM

## 2020-04-14 LAB — SARS-COV-2, NAA 2 DAY TAT

## 2020-04-14 LAB — NOVEL CORONAVIRUS, NAA: SARS-CoV-2, NAA: NOT DETECTED

## 2020-04-19 ENCOUNTER — Other Ambulatory Visit: Payer: Self-pay

## 2023-08-12 ENCOUNTER — Other Ambulatory Visit (HOSPITAL_BASED_OUTPATIENT_CLINIC_OR_DEPARTMENT_OTHER): Payer: Self-pay

## 2023-08-13 ENCOUNTER — Other Ambulatory Visit (HOSPITAL_BASED_OUTPATIENT_CLINIC_OR_DEPARTMENT_OTHER): Payer: Self-pay

## 2023-08-13 MED ORDER — WEGOVY 0.25 MG/0.5ML ~~LOC~~ SOAJ
0.2500 mg | SUBCUTANEOUS | 0 refills | Status: DC
  Filled 2023-08-13 – 2023-08-30 (×2): qty 2, 28d supply, fill #0

## 2023-08-14 ENCOUNTER — Other Ambulatory Visit (HOSPITAL_BASED_OUTPATIENT_CLINIC_OR_DEPARTMENT_OTHER): Payer: Self-pay

## 2023-08-14 MED ORDER — ONDANSETRON HCL 8 MG PO TABS
8.0000 mg | ORAL_TABLET | Freq: Three times a day (TID) | ORAL | 0 refills | Status: AC | PRN
  Filled 2023-08-14 – 2023-08-30 (×2): qty 42, 14d supply, fill #0

## 2023-08-16 ENCOUNTER — Other Ambulatory Visit (HOSPITAL_BASED_OUTPATIENT_CLINIC_OR_DEPARTMENT_OTHER): Payer: Self-pay

## 2023-08-24 ENCOUNTER — Other Ambulatory Visit (HOSPITAL_BASED_OUTPATIENT_CLINIC_OR_DEPARTMENT_OTHER): Payer: Self-pay

## 2023-08-27 ENCOUNTER — Other Ambulatory Visit (HOSPITAL_BASED_OUTPATIENT_CLINIC_OR_DEPARTMENT_OTHER): Payer: Self-pay

## 2023-08-29 ENCOUNTER — Other Ambulatory Visit (HOSPITAL_BASED_OUTPATIENT_CLINIC_OR_DEPARTMENT_OTHER): Payer: Self-pay

## 2023-08-29 MED ORDER — PHENTERMINE HCL 37.5 MG PO TABS
37.5000 mg | ORAL_TABLET | Freq: Every day | ORAL | 0 refills | Status: AC
  Filled 2023-09-14: qty 30, 30d supply, fill #0

## 2023-08-29 MED ORDER — TOPIRAMATE 25 MG PO TABS
25.0000 mg | ORAL_TABLET | Freq: Every day | ORAL | 0 refills | Status: AC
  Filled 2023-08-29 – 2023-08-31 (×3): qty 30, 30d supply, fill #0

## 2023-08-30 ENCOUNTER — Other Ambulatory Visit (HOSPITAL_BASED_OUTPATIENT_CLINIC_OR_DEPARTMENT_OTHER): Payer: Self-pay

## 2023-08-31 ENCOUNTER — Other Ambulatory Visit (HOSPITAL_BASED_OUTPATIENT_CLINIC_OR_DEPARTMENT_OTHER): Payer: Self-pay

## 2023-09-14 ENCOUNTER — Other Ambulatory Visit (HOSPITAL_BASED_OUTPATIENT_CLINIC_OR_DEPARTMENT_OTHER): Payer: Self-pay

## 2023-09-24 ENCOUNTER — Other Ambulatory Visit (HOSPITAL_BASED_OUTPATIENT_CLINIC_OR_DEPARTMENT_OTHER): Payer: Self-pay

## 2023-10-10 ENCOUNTER — Other Ambulatory Visit (HOSPITAL_BASED_OUTPATIENT_CLINIC_OR_DEPARTMENT_OTHER): Payer: Self-pay

## 2023-10-10 MED ORDER — WEGOVY 0.25 MG/0.5ML ~~LOC~~ SOAJ
0.2500 mg | SUBCUTANEOUS | 0 refills | Status: AC
  Filled 2023-10-10: qty 2, 28d supply, fill #0

## 2023-10-18 ENCOUNTER — Other Ambulatory Visit (HOSPITAL_BASED_OUTPATIENT_CLINIC_OR_DEPARTMENT_OTHER): Payer: Self-pay

## 2023-10-31 ENCOUNTER — Other Ambulatory Visit: Payer: Self-pay

## 2023-10-31 ENCOUNTER — Other Ambulatory Visit (HOSPITAL_BASED_OUTPATIENT_CLINIC_OR_DEPARTMENT_OTHER): Payer: Self-pay

## 2023-10-31 MED ORDER — WEGOVY 0.5 MG/0.5ML ~~LOC~~ SOAJ
0.5000 mg | SUBCUTANEOUS | 0 refills | Status: DC
  Filled 2023-10-31 – 2023-11-27 (×2): qty 2, 28d supply, fill #0

## 2023-11-10 ENCOUNTER — Other Ambulatory Visit (HOSPITAL_BASED_OUTPATIENT_CLINIC_OR_DEPARTMENT_OTHER): Payer: Self-pay

## 2023-11-27 ENCOUNTER — Other Ambulatory Visit (HOSPITAL_BASED_OUTPATIENT_CLINIC_OR_DEPARTMENT_OTHER): Payer: Self-pay

## 2023-12-24 ENCOUNTER — Other Ambulatory Visit (HOSPITAL_BASED_OUTPATIENT_CLINIC_OR_DEPARTMENT_OTHER): Payer: Self-pay

## 2023-12-24 MED ORDER — WEGOVY 0.5 MG/0.5ML ~~LOC~~ SOAJ
0.5000 mg | SUBCUTANEOUS | 0 refills | Status: DC
  Filled 2023-12-24 – 2024-01-11 (×2): qty 2, 28d supply, fill #0

## 2024-01-03 ENCOUNTER — Other Ambulatory Visit (HOSPITAL_BASED_OUTPATIENT_CLINIC_OR_DEPARTMENT_OTHER): Payer: Self-pay

## 2024-01-11 ENCOUNTER — Other Ambulatory Visit (HOSPITAL_BASED_OUTPATIENT_CLINIC_OR_DEPARTMENT_OTHER): Payer: Self-pay

## 2024-02-25 ENCOUNTER — Other Ambulatory Visit (HOSPITAL_BASED_OUTPATIENT_CLINIC_OR_DEPARTMENT_OTHER): Payer: Self-pay

## 2024-02-25 MED ORDER — WEGOVY 0.5 MG/0.5ML ~~LOC~~ SOAJ
0.5000 mL | SUBCUTANEOUS | 0 refills | Status: AC
  Filled 2024-02-25: qty 2, 28d supply, fill #0

## 2024-02-27 ENCOUNTER — Other Ambulatory Visit (HOSPITAL_BASED_OUTPATIENT_CLINIC_OR_DEPARTMENT_OTHER): Payer: Self-pay

## 2024-02-29 ENCOUNTER — Other Ambulatory Visit (HOSPITAL_BASED_OUTPATIENT_CLINIC_OR_DEPARTMENT_OTHER): Payer: Self-pay

## 2024-03-08 ENCOUNTER — Other Ambulatory Visit (HOSPITAL_BASED_OUTPATIENT_CLINIC_OR_DEPARTMENT_OTHER): Payer: Self-pay

## 2024-03-14 ENCOUNTER — Other Ambulatory Visit (HOSPITAL_BASED_OUTPATIENT_CLINIC_OR_DEPARTMENT_OTHER): Payer: Self-pay

## 2024-03-17 ENCOUNTER — Other Ambulatory Visit (HOSPITAL_BASED_OUTPATIENT_CLINIC_OR_DEPARTMENT_OTHER): Payer: Self-pay

## 2024-03-18 ENCOUNTER — Other Ambulatory Visit: Payer: Self-pay

## 2024-04-17 ENCOUNTER — Other Ambulatory Visit: Payer: Self-pay | Admitting: Nurse Practitioner

## 2024-04-17 ENCOUNTER — Ambulatory Visit
Admission: RE | Admit: 2024-04-17 | Discharge: 2024-04-17 | Disposition: A | Discharge status: Home / Self Care | Source: Ambulatory Visit | Attending: Nurse Practitioner | Admitting: Nurse Practitioner

## 2024-04-17 DIAGNOSIS — R053 Chronic cough: Secondary | ICD-10-CM

## 2024-06-28 ENCOUNTER — Other Ambulatory Visit: Payer: Self-pay | Admitting: Nurse Practitioner

## 2024-06-28 DIAGNOSIS — R946 Abnormal results of thyroid function studies: Secondary | ICD-10-CM

## 2024-06-28 NOTE — Progress Notes (Signed)
 06-13-2024 Thyroid Ultrasound Hyperemia of bilateral thyroid lobes could represent thyroiditis or Graves disease in appropriate clinical setting. Correlate with thyroid function and clinical picture. Prominent cervical chain lymph nodes without suspicious morphologic features, likely reactive.

## 2024-08-20 ENCOUNTER — Other Ambulatory Visit (HOSPITAL_BASED_OUTPATIENT_CLINIC_OR_DEPARTMENT_OTHER): Payer: Self-pay

## 2024-08-20 MED ORDER — WEGOVY 0.25 MG/0.5ML ~~LOC~~ SOAJ
0.2500 mg | SUBCUTANEOUS | 0 refills | Status: AC
  Filled 2024-08-20: qty 2, 28d supply, fill #0

## 2024-09-09 ENCOUNTER — Encounter (INDEPENDENT_AMBULATORY_CARE_PROVIDER_SITE_OTHER): Payer: Self-pay
